# Patient Record
Sex: Male | Born: 1940 | Hispanic: No
Health system: Southern US, Community
[De-identification: ages and names within clinical notes are randomized; demographics above are authoritative.]

## PROBLEM LIST (undated history)

## (undated) DIAGNOSIS — J449 Chronic obstructive pulmonary disease, unspecified: Secondary | ICD-10-CM

## (undated) DIAGNOSIS — J15211 Pneumonia due to Methicillin susceptible Staphylococcus aureus: Secondary | ICD-10-CM

## (undated) DIAGNOSIS — M301 Polyarteritis with lung involvement [Churg-Strauss]: Secondary | ICD-10-CM

## (undated) DIAGNOSIS — D7218 Eosinophilia in diseases classified elsewhere: Secondary | ICD-10-CM

## (undated) DIAGNOSIS — J9621 Acute and chronic respiratory failure with hypoxia: Secondary | ICD-10-CM

---

## 2020-02-28 ENCOUNTER — Other Ambulatory Visit (HOSPITAL_COMMUNITY): Payer: Federal, State, Local not specified - PPO

## 2020-02-28 ENCOUNTER — Inpatient Hospital Stay
Admission: RE | Admit: 2020-02-28 | Discharge: 2020-03-17 | Disposition: A | Discharge status: Skilled Nursing Facility | Payer: Federal, State, Local not specified - PPO | Source: Other Acute Inpatient Hospital

## 2020-02-28 DIAGNOSIS — J15211 Pneumonia due to Methicillin susceptible Staphylococcus aureus: Secondary | ICD-10-CM | POA: Diagnosis present

## 2020-02-28 DIAGNOSIS — J189 Pneumonia, unspecified organism: Secondary | ICD-10-CM

## 2020-02-28 DIAGNOSIS — J398 Other specified diseases of upper respiratory tract: Secondary | ICD-10-CM

## 2020-02-28 DIAGNOSIS — J449 Chronic obstructive pulmonary disease, unspecified: Secondary | ICD-10-CM | POA: Diagnosis present

## 2020-02-28 DIAGNOSIS — M301 Polyarteritis with lung involvement [Churg-Strauss]: Secondary | ICD-10-CM | POA: Diagnosis present

## 2020-02-28 DIAGNOSIS — Z931 Gastrostomy status: Secondary | ICD-10-CM

## 2020-02-28 DIAGNOSIS — J9621 Acute and chronic respiratory failure with hypoxia: Secondary | ICD-10-CM | POA: Diagnosis present

## 2020-02-28 DIAGNOSIS — J9690 Respiratory failure, unspecified, unspecified whether with hypoxia or hypercapnia: Secondary | ICD-10-CM

## 2020-02-28 DIAGNOSIS — D7218 Eosinophilia in diseases classified elsewhere: Secondary | ICD-10-CM | POA: Diagnosis present

## 2020-02-28 HISTORY — DX: Polyarteritis with lung involvement (churg-strauss): M30.1

## 2020-02-28 HISTORY — DX: Acute and chronic respiratory failure with hypoxia: J96.21

## 2020-02-28 HISTORY — DX: Eosinophilia in diseases classified elsewhere: D72.18

## 2020-02-28 HISTORY — DX: Pneumonia due to methicillin susceptible Staphylococcus aureus: J15.211

## 2020-02-28 HISTORY — DX: Chronic obstructive pulmonary disease, unspecified: J44.9

## 2020-02-28 MED ORDER — IOHEXOL 300 MG/ML  SOLN: 50.0000 mL | Freq: Once | INTRAMUSCULAR | Status: DC | PRN

## 2020-02-29 ENCOUNTER — Other Ambulatory Visit (HOSPITAL_COMMUNITY): Payer: Federal, State, Local not specified - PPO

## 2020-02-29 DIAGNOSIS — J449 Chronic obstructive pulmonary disease, unspecified: Secondary | ICD-10-CM | POA: Diagnosis present

## 2020-02-29 DIAGNOSIS — D7218 Eosinophilia in diseases classified elsewhere: Secondary | ICD-10-CM | POA: Diagnosis present

## 2020-02-29 DIAGNOSIS — J15211 Pneumonia due to Methicillin susceptible Staphylococcus aureus: Secondary | ICD-10-CM | POA: Diagnosis present

## 2020-02-29 DIAGNOSIS — J9621 Acute and chronic respiratory failure with hypoxia: Secondary | ICD-10-CM

## 2020-02-29 DIAGNOSIS — M301 Polyarteritis with lung involvement [Churg-Strauss]: Secondary | ICD-10-CM | POA: Diagnosis not present

## 2020-02-29 LAB — COMPREHENSIVE METABOLIC PANEL
ALT: 23 U/L (ref 0–44)
AST: 19 U/L (ref 15–41)
Albumin: 2.6 g/dL — ABNORMAL LOW (ref 3.5–5.0)
Alkaline Phosphatase: 62 U/L (ref 38–126)
Anion gap: 6 (ref 5–15)
BUN: 21 mg/dL (ref 8–23)
CO2: 38 mmol/L — ABNORMAL HIGH (ref 22–32)
Calcium: 9.2 mg/dL (ref 8.9–10.3)
Chloride: 92 mmol/L — ABNORMAL LOW (ref 98–111)
Creatinine, Ser: 0.69 mg/dL (ref 0.61–1.24)
GFR calc Af Amer: >60 mL/min (ref >60)
GFR calc non Af Amer: >60 mL/min (ref >60)
Glucose, Bld: 132 mg/dL — ABNORMAL HIGH (ref 70–99)
Potassium: 4.2 mmol/L (ref 3.5–5.1)
Sodium: 136 mmol/L (ref 135–145)
Total Bilirubin: 0.5 mg/dL (ref 0.3–1.2)
Total Protein: 6.2 g/dL — ABNORMAL LOW (ref 6.5–8.1)

## 2020-02-29 LAB — CBC
HCT: 29.5 % — ABNORMAL LOW (ref 39.0–52.0)
Hemoglobin: 9.4 g/dL — ABNORMAL LOW (ref 13.0–17.0)
MCH: 30.9 pg (ref 26.0–34.0)
MCHC: 31.9 g/dL (ref 30.0–36.0)
MCV: 97 fL (ref 80.0–100.0)
Platelets: 299 10*3/uL (ref 150–400)
RBC: 3.04 MIL/uL — ABNORMAL LOW (ref 4.22–5.81)
RDW: 12.8 % (ref 11.5–15.5)
WBC: 6.5 10*3/uL (ref 4.0–10.5)
nRBC: 0 % (ref 0.0–0.2)

## 2020-02-29 LAB — PROTIME-INR
INR: 1.1 (ref 0.8–1.2)
Prothrombin Time: 13.9 seconds (ref 11.4–15.2)

## 2020-02-29 MED ORDER — IOHEXOL 350 MG/ML SOLN
80.0000 mL | Freq: Once | INTRAVENOUS | Status: AC | PRN
  Administered 2020-02-29: 80 mL via INTRAVENOUS

## 2020-02-29 NOTE — Consult Note (Signed)
Pulmonary Critical Care Medicine [facility] [place]  PULMONARY SERVICE  Date of Service: 02/29/2020  PULMONARY CRITICAL CARE CONSULT   Vincent Archer  KVQ:[id number]  DOB: 01/24/1941   DOA: 02/28/2020  Referring Physician: Carron Curie, MD  HPI: Vincent Archer is a 79 y.o. male seen for follow up of Acute on Chronic Respiratory Failure.  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  Apparently has a history of oropharyngeal dysphagia and had been initially seen by IV social worker placed in a hospital admission and then discharged to skilled nursing facility.  Patient came back to the hospital because of sepsis and shock.  The patient was treated with antibiotics had some issues with ongoing respiratory failure requiring oxygen.  Patient right now is on a face tent requiring 35% FiO2.  Chest x-ray revealed no definite acute changes however he did have some abnormalities of the diaphragms.  Therefore recommended that we get a CT scan.  Review of Systems:  ROS performed and is unremarkable other than noted above.  Past medical history: COPD Obstructive sleep apnea Chronic obstructive asthma Pulmonary embolism BPH Churg-Strauss on steroids Sinusitis  Past surgical history: Maxillary sinus resection G-tube placement  Family history: Unknown  Social history: Apparently was a resident of a nursing home  Allergies: Reviewed on the medical chart  Medications: Reviewed on Rounds  Physical Exam:  Vitals: T 96.8 pulse 91 respiratory rate 34 blood pressure 112/64 saturations 98%  Ventilator Settings off of the ventilator 35% FiO2 on face tent  . General: Comfortable at this time . Eyes: Grossly normal lids, irises & conjunctiva . ENT: grossly tongue is normal . Neck: no obvious mass . Cardiovascular: S1-S2 normal no gallop or  rub . Respiratory: No rhonchi no rales are noted . Abdomen: Soft and nontender . Skin: no rash seen on limited exam . Musculoskeletal: not rigid . Psychiatric:unable to assess . Neurologic: no seizure no involuntary movements         Labs on Admission:  Basic Metabolic Panel: Recent Labs  Lab 02/29/20 0651  NA 136  K 4.2  CL 92*  CO2 38*  GLUCOSE 132*  BUN 21  CREATININE 0.69  CALCIUM 9.2    No results for input(s): PHART, PCO2ART, PO2ART, HCO3, O2SAT in the last 168 hours.  Liver Function Tests: Recent Labs  Lab 02/29/20 0651  AST 19  ALT 23  ALKPHOS 62  BILITOT 0.5  PROT 6.2*  ALBUMIN 2.6*   No results for input(s): LIPASE, AMYLASE in the last 168 hours. No results for input(s): AMMONIA in the last 168 hours.  CBC: Recent Labs  Lab 02/29/20 0651  WBC 6.5  HGB 9.4*  HCT 29.5*  MCV 97.0  PLT 299    Cardiac Enzymes: No results for input(s): CKTOTAL, CKMB, CKMBINDEX, TROPONINI in the last 168 hours.  BNP (last 3 results) No results for input(s): BNP in the last 8760 hours.  ProBNP (last 3 results) No results for input(s): PROBNP in the last 8760 hours.   Radiological Exams on Admission: DG ABDOMEN PEG TUBE LOCATION  Result Date: 02/28/2020 CLINICAL DATA:  Peg tube placement. EXAM: ABDOMEN - 1 VIEW COMPARISON:  None. FINDINGS: Injection of contrast through the patient's PEG tube opacifies the stomach. A small amount of contrast is noted in the proximal duodenum. There is no definite pneumoperitoneum. The bowel gas pattern is nonobstructive. IMPRESSION: Injection of contrast through  the patient's PEG tube opacifies the stomach. Electronically Signed   By: Constance Holster M.D.   On: 02/28/2020 23:46   DG CHEST PORT 1 VIEW  Result Date: 02/28/2020 CLINICAL DATA:  Respiratory failure. EXAM: PORTABLE CHEST 1 VIEW COMPARISON:  None. FINDINGS: The lungs are hyperexpanded. There is some questionable thickening of the right paratracheal stripe. There is no  pneumothorax. There is some pleuroparenchymal scarring at the lung apices. There is no large focal infiltrate. No significant pleural effusion. The heart size is normal. Aortic calcifications are noted. There is no acute osseous abnormality. IMPRESSION: 1. No definite acute cardiopulmonary process. 2. COPD. 3. Suggestion of a thickening of the right paratracheal stripe. An outpatient contrast enhanced CT is recommended for further evaluation of this finding. Electronically Signed   By: Constance Holster M.D.   On: 02/28/2020 23:45    Assessment/Plan Active Problems:   Acute on chronic respiratory failure with hypoxia (HCC)   COPD, severe (HCC)   Churg-Strauss syndrome with lung involvement (HCC)   MSSA (methicillin susceptible Staphylococcus aureus) pneumonia ([place])   1. Acute on chronic respiratory failure with hypoxia patient has significant hypoxia noted follow-up chest x-ray looks relatively benign.  Because of the findings of the significant hypoxia recommended getting a CT scan of the chest to make certain there is no underlying pulmonary vascular disease. 2. Severe COPD patient should be on nebulizers as necessary we will continue to follow along. 3. Churg-Strauss syndrome again CT scan was ordered no definitive changes were noted on the chest x-ray.  Patient is on chronic steroids. 4. MSSA pneumonia treated with antibiotics we will continue with supportive care follow radiologically   I have personally seen and evaluated the patient, evaluated laboratory and imaging results, formulated the assessment and plan and placed orders. The Patient requires high complexity decision making with multiple systems involvement.  Case was discussed on Rounds with the Respiratory Therapy Director and the Respiratory staff Time Spent 41minutes  Hanifa Antonetti A Eilee Schader, MD [facility] Pulmonary Critical Care Medicine Sleep Medicine

## 2020-03-01 DIAGNOSIS — J449 Chronic obstructive pulmonary disease, unspecified: Secondary | ICD-10-CM | POA: Diagnosis not present

## 2020-03-01 DIAGNOSIS — J15211 Pneumonia due to Methicillin susceptible Staphylococcus aureus: Secondary | ICD-10-CM | POA: Diagnosis not present

## 2020-03-01 DIAGNOSIS — M301 Polyarteritis with lung involvement [Churg-Strauss]: Secondary | ICD-10-CM | POA: Diagnosis not present

## 2020-03-01 DIAGNOSIS — J9621 Acute and chronic respiratory failure with hypoxia: Secondary | ICD-10-CM | POA: Diagnosis not present

## 2020-03-01 NOTE — Progress Notes (Signed)
Pulmonary Critical Care Medicine [facility] [place]   PULMONARY CRITICAL CARE SERVICE  PROGRESS NOTE  Date of Service: 03/01/2020  Vincent Archer  WVP:[id number]  DOB: 01/24/1941   DOA: 02/28/2020  Referring Physician: Carron Curie, MD  HPI: Vincent Archer is a 79 y.o. male seen for follow up of Acute on Chronic Respiratory Failure.  Patient at this time is comfortable without distress was on 35% face tent.  CT scan was done yesterday shows areas of bronchiectasis some areas of infiltrate.  Patient has an underlying diagnosis of Churg-Strauss as already noted previously however there is some blood in tree appearance also concerning for MAI infection.  Patient has been on chronic steroids however patient is not on any PCP prophylaxis  Medications: Reviewed on Rounds  Physical Exam:  Vitals: Temperature 97.0 pulse 94 respiratory rate 30 blood pressure is 144/71 saturations 96%  Ventilator Settings off the ventilator on 35% FiO2  . General: Comfortable at this time . Eyes: Grossly normal lids, irises & conjunctiva . ENT: grossly tongue is normal . Neck: no obvious mass . Cardiovascular: S1 S2 normal no gallop . Respiratory: No rhonchi coarse breath sounds . Abdomen: soft . Skin: no rash seen on limited exam . Musculoskeletal: not rigid . Psychiatric:unable to assess . Neurologic: no seizure no involuntary movements         Lab Data:   Basic Metabolic Panel: Recent Labs  Lab 02/29/20 0651  NA 136  K 4.2  CL 92*  CO2 38*  GLUCOSE 132*  BUN 21  CREATININE 0.69  CALCIUM 9.2    ABG: No results for input(s): PHART, PCO2ART, PO2ART, HCO3, O2SAT in the last 168 hours.  Liver Function Tests: Recent Labs  Lab 02/29/20 0651  AST 19  ALT 23  ALKPHOS 62  BILITOT 0.5  PROT 6.2*  ALBUMIN 2.6*   No results for input(s): LIPASE, AMYLASE in the last 168 hours. No results for input(s): AMMONIA in the last 168 hours.  CBC: Recent Labs  Lab 02/29/20 0651  WBC 6.5   HGB 9.4*  HCT 29.5*  MCV 97.0  PLT 299    Cardiac Enzymes: No results for input(s): CKTOTAL, CKMB, CKMBINDEX, TROPONINI in the last 168 hours.  BNP (last 3 results) No results for input(s): BNP in the last 8760 hours.  ProBNP (last 3 results) No results for input(s): PROBNP in the last 8760 hours.  Radiological Exams: CT ANGIO CHEST PE W OR WO CONTRAST  Result Date: 02/29/2020 CLINICAL DATA:  Shortness of breath EXAM: CT ANGIOGRAPHY CHEST WITH CONTRAST TECHNIQUE: Multidetector CT imaging of the chest was performed using the standard protocol during bolus administration of intravenous contrast. Multiplanar CT image reconstructions and MIPs were obtained to evaluate the vascular anatomy. CONTRAST:  49mL OMNIPAQUE IOHEXOL 350 MG/ML SOLN COMPARISON:  None. FINDINGS: Cardiovascular: Contrast injection is sufficient to demonstrate satisfactory opacification of the pulmonary arteries to the segmental level. There is no pulmonary embolus. The main pulmonary artery is within normal limits for size. There are atherosclerotic changes of the thoracic aorta without evidence for aneurysm. The heart size is normal. There is a small pericardial effusion. Coronary artery calcifications are noted. Mediastinum/Nodes: --No mediastinal or hilar lymphadenopathy. --No axillary lymphadenopathy. --No supraclavicular lymphadenopathy. --Normal thyroid gland. --The esophagus is unremarkable Lungs/Pleura: Extensive bronchial wall thickening and mucus plugging at the lung bases bilaterally. There are tree in bud airspace opacities in the right upper lobe. There is bronchiectasis bilaterally, most evident in the left upper lobe. There is debris  within the right mainstem bronchus and lower trachea. There is no pneumothorax. There is no large pleural effusion. The lungs are hyperexpanded. There is atelectasis and scarring at the lung bases. Upper Abdomen: A percutaneous gastrostomy tube is partially visualized in the upper  abdomen. There is no definite acute abnormality within the upper abdomen. Musculoskeletal: There is an age-indeterminate severe compression fracture of the T6 vertebral body. There is no significant retropulsion at this level. This fractures favored to be chronic. Review of the MIP images confirms the above findings. IMPRESSION: 1. No acute pulmonary embolism. 2. Findings suggestive of infectious or reactive bronchiolitis as detailed above. 3. Hyperexpanded lungs which can be seen in patients with COPD. 4. Small pericardial effusion. 5. Age-indeterminate compression fracture of the T6 vertebral body, favored to be chronic. Correlation with physical exam is recommended. Aortic Atherosclerosis (ICD10-I70.0). Electronically Signed   By: Constance Holster M.D.   On: 02/29/2020 17:44   DG ABDOMEN PEG TUBE LOCATION  Result Date: 02/28/2020 CLINICAL DATA:  Peg tube placement. EXAM: ABDOMEN - 1 VIEW COMPARISON:  None. FINDINGS: Injection of contrast through the patient's PEG tube opacifies the stomach. A small amount of contrast is noted in the proximal duodenum. There is no definite pneumoperitoneum. The bowel gas pattern is nonobstructive. IMPRESSION: Injection of contrast through the patient's PEG tube opacifies the stomach. Electronically Signed   By: Constance Holster M.D.   On: 02/28/2020 23:46   DG CHEST PORT 1 VIEW  Result Date: 02/28/2020 CLINICAL DATA:  Respiratory failure. EXAM: PORTABLE CHEST 1 VIEW COMPARISON:  None. FINDINGS: The lungs are hyperexpanded. There is some questionable thickening of the right paratracheal stripe. There is no pneumothorax. There is some pleuroparenchymal scarring at the lung apices. There is no large focal infiltrate. No significant pleural effusion. The heart size is normal. Aortic calcifications are noted. There is no acute osseous abnormality. IMPRESSION: 1. No definite acute cardiopulmonary process. 2. COPD. 3. Suggestion of a thickening of the right paratracheal  stripe. An outpatient contrast enhanced CT is recommended for further evaluation of this finding. Electronically Signed   By: Constance Holster M.D.   On: 02/28/2020 23:45    Assessment/Plan Active Problems:   Acute on chronic respiratory failure with hypoxia (HCC)   COPD, severe (HCC)   Churg-Strauss syndrome with lung involvement (HCC)   MSSA (methicillin susceptible Staphylococcus aureus) pneumonia ([place])   1. Acute on chronic respiratory failure with hypoxia plan is to continue with full oxygen therapy supportive care.  The CT findings are of concern with traction bronchiectasis also some groundglass opacity changes were noted.  I have recommended that we get a QuantiFERON gold assay on him and also he should be on Bactrim prophylaxis. 2. Churg-Strauss syndrome patient is on chronic steroid therapy should also be on Bactrim prophylaxis which I went ahead and started him on. 3. MSSA pneumonia treated we will continue with supportive care 4. Severe COPD at baseline continue to monitor.   I have personally seen and evaluated the patient, evaluated laboratory and imaging results, formulated the assessment and plan and placed orders. The Patient requires high complexity decision making with multiple systems involvement.  Rounds were done with the Respiratory Therapy Director and Staff therapists and discussed with nursing staff also.  Allyne Gee, MD [facility] Pulmonary Critical Care Medicine Sleep Medicine

## 2020-03-02 DIAGNOSIS — J15211 Pneumonia due to Methicillin susceptible Staphylococcus aureus: Secondary | ICD-10-CM | POA: Diagnosis not present

## 2020-03-02 DIAGNOSIS — J449 Chronic obstructive pulmonary disease, unspecified: Secondary | ICD-10-CM | POA: Diagnosis not present

## 2020-03-02 DIAGNOSIS — J9621 Acute and chronic respiratory failure with hypoxia: Secondary | ICD-10-CM | POA: Diagnosis not present

## 2020-03-02 DIAGNOSIS — M301 Polyarteritis with lung involvement [Churg-Strauss]: Secondary | ICD-10-CM | POA: Diagnosis not present

## 2020-03-02 LAB — CBC
HCT: 29.1 % — ABNORMAL LOW (ref 39.0–52.0)
Hemoglobin: 9.1 g/dL — ABNORMAL LOW (ref 13.0–17.0)
MCH: 30 pg (ref 26.0–34.0)
MCHC: 31.3 g/dL (ref 30.0–36.0)
MCV: 96 fL (ref 80.0–100.0)
Platelets: 295 10*3/uL (ref 150–400)
RBC: 3.03 MIL/uL — ABNORMAL LOW (ref 4.22–5.81)
RDW: 12.7 % (ref 11.5–15.5)
WBC: 6.9 10*3/uL (ref 4.0–10.5)
nRBC: 0 % (ref 0.0–0.2)

## 2020-03-02 LAB — BASIC METABOLIC PANEL
Anion gap: 5 (ref 5–15)
BUN: 34 mg/dL — ABNORMAL HIGH (ref 8–23)
CO2: 38 mmol/L — ABNORMAL HIGH (ref 22–32)
Calcium: 9 mg/dL (ref 8.9–10.3)
Chloride: 91 mmol/L — ABNORMAL LOW (ref 98–111)
Creatinine, Ser: 0.84 mg/dL (ref 0.61–1.24)
GFR calc Af Amer: >60 mL/min (ref >60)
GFR calc non Af Amer: >60 mL/min (ref >60)
Glucose, Bld: 134 mg/dL — ABNORMAL HIGH (ref 70–99)
Potassium: 4.4 mmol/L (ref 3.5–5.1)
Sodium: 134 mmol/L — ABNORMAL LOW (ref 135–145)

## 2020-03-02 LAB — MAGNESIUM: Magnesium: 2 mg/dL (ref 1.7–2.4)

## 2020-03-02 LAB — PHOSPHORUS: Phosphorus: 3.4 mg/dL (ref 2.5–4.6)

## 2020-03-02 NOTE — Progress Notes (Signed)
Pulmonary Critical Care Medicine [facility] [place]   PULMONARY CRITICAL CARE SERVICE  PROGRESS NOTE  Date of Service: 03/02/2020  Vincent Archer  RUE:[id number]  DOB: 01/24/1941   DOA: 02/28/2020  Referring Physician: Carron Curie, MD  HPI: Vincent Archer is a 79 y.o. male seen for follow up of Acute on Chronic Respiratory Failure.  Patient currently is on 35% FiO2 by face tent no significant worsening of the oxygen has been noted.  CT scan had shown significant bronchiectasis consistent with chronic pulmonary disease there was some concern about possibility of an MAI infection with a tree-in-bud appearance  Medications: Reviewed on Rounds  Physical Exam:  Vitals: Temperature 97.0 pulse 117 respiratory rate 26 blood pressure is 151/70 saturations 97%  Ventilator Settings on 35% FiO2  . General: Comfortable at this time . Eyes: Grossly normal lids, irises & conjunctiva . ENT: grossly tongue is normal . Neck: no obvious mass . Cardiovascular: S1 S2 normal no gallop . Respiratory: Scattered rhonchi noted bilaterally . Abdomen: soft . Skin: no rash seen on limited exam . Musculoskeletal: not rigid . Psychiatric:unable to assess . Neurologic: no seizure no involuntary movements         Lab Data:   Basic Metabolic Panel: Recent Labs  Lab 02/29/20 0651 03/02/20 0417  NA 136 134*  K 4.2 4.4  CL 92* 91*  CO2 38* 38*  GLUCOSE 132* 134*  BUN 21 34*  CREATININE 0.69 0.84  CALCIUM 9.2 9.0  MG  --  2.0  PHOS  --  3.4    ABG: No results for input(s): PHART, PCO2ART, PO2ART, HCO3, O2SAT in the last 168 hours.  Liver Function Tests: Recent Labs  Lab 02/29/20 0651  AST 19  ALT 23  ALKPHOS 62  BILITOT 0.5  PROT 6.2*  ALBUMIN 2.6*   No results for input(s): LIPASE, AMYLASE in the last 168 hours. No results for input(s): AMMONIA in the last 168 hours.  CBC: Recent Labs  Lab 02/29/20 0651 03/02/20 0417  WBC 6.5 6.9  HGB 9.4* 9.1*  HCT 29.5* 29.1*  MCV 97.0  96.0  PLT 299 295    Cardiac Enzymes: No results for input(s): CKTOTAL, CKMB, CKMBINDEX, TROPONINI in the last 168 hours.  BNP (last 3 results) No results for input(s): BNP in the last 8760 hours.  ProBNP (last 3 results) No results for input(s): PROBNP in the last 8760 hours.  Radiological Exams: No results found.  Assessment/Plan Active Problems:   Acute on chronic respiratory failure with hypoxia (HCC)   COPD, severe (HCC)   Churg-Strauss syndrome with lung involvement (HCC)   MSSA (methicillin susceptible Staphylococcus aureus) pneumonia (HCC)   1. Chronic respiratory failure with hypoxia patient is maintaining oxygenation plan is to continue with current oxygen levels and monitor closely. 2. Severe COPD advanced disease we will continue with nebulizers as needed 3. Churg-Strauss syndrome advanced disease again with bronchiectatic changes and some groundglass opacity changes noted still being present patient is on chronic steroids.  Patient was started on Bactrim prophylaxis also awaiting results of the QuantiFERON gold 4. MSSA pneumonia treated we will continue with supportive care   I have personally seen and evaluated the patient, evaluated laboratory and imaging results, formulated the assessment and plan and placed orders. The Patient requires high complexity decision making with multiple systems involvement.  Rounds were done with the Respiratory Therapy Director and Staff therapists and discussed with nursing staff also.  Yevonne Pax, MD [facility] Pulmonary Critical Care Medicine Sleep  Medicine

## 2020-03-03 DIAGNOSIS — J15211 Pneumonia due to Methicillin susceptible Staphylococcus aureus: Secondary | ICD-10-CM | POA: Diagnosis not present

## 2020-03-03 DIAGNOSIS — M301 Polyarteritis with lung involvement [Churg-Strauss]: Secondary | ICD-10-CM | POA: Diagnosis not present

## 2020-03-03 DIAGNOSIS — J449 Chronic obstructive pulmonary disease, unspecified: Secondary | ICD-10-CM | POA: Diagnosis not present

## 2020-03-03 DIAGNOSIS — J9621 Acute and chronic respiratory failure with hypoxia: Secondary | ICD-10-CM | POA: Diagnosis not present

## 2020-03-03 NOTE — Progress Notes (Signed)
Pulmonary Critical Care Medicine [facility] [place]   PULMONARY CRITICAL CARE SERVICE  PROGRESS NOTE  Date of Service: 03/03/2020  Shaarav Ripple  MVH:[id number]  DOB: 01/24/1941   DOA: 02/28/2020  Referring Physician: Carron Curie, MD  HPI: Dimitrious Micciche is a 79 y.o. male seen for follow up of Acute on Chronic Respiratory Failure.  Patient at this time is on 35% FiO2 and using the face tent  Medications: Reviewed on Rounds  Physical Exam:  Vitals: Temperature is 97.6 pulse 82 respiratory 26 blood pressure is 116/62 saturations 92%  Ventilator Settings off the ventilator right now on FaceTime  . General: Comfortable at this time . Eyes: Grossly normal lids, irises & conjunctiva . ENT: grossly tongue is normal . Neck: no obvious mass . Cardiovascular: S1 S2 normal no gallop . Respiratory: No rhonchi no rales are noted at this time . Abdomen: soft . Skin: no rash seen on limited exam . Musculoskeletal: not rigid . Psychiatric:unable to assess . Neurologic: no seizure no involuntary movements         Lab Data:   Basic Metabolic Panel: Recent Labs  Lab 02/29/20 0651 03/02/20 0417  NA 136 134*  K 4.2 4.4  CL 92* 91*  CO2 38* 38*  GLUCOSE 132* 134*  BUN 21 34*  CREATININE 0.69 0.84  CALCIUM 9.2 9.0  MG  --  2.0  PHOS  --  3.4    ABG: No results for input(s): PHART, PCO2ART, PO2ART, HCO3, O2SAT in the last 168 hours.  Liver Function Tests: Recent Labs  Lab 02/29/20 0651  AST 19  ALT 23  ALKPHOS 62  BILITOT 0.5  PROT 6.2*  ALBUMIN 2.6*   No results for input(s): LIPASE, AMYLASE in the last 168 hours. No results for input(s): AMMONIA in the last 168 hours.  CBC: Recent Labs  Lab 02/29/20 0651 03/02/20 0417  WBC 6.5 6.9  HGB 9.4* 9.1*  HCT 29.5* 29.1*  MCV 97.0 96.0  PLT 299 295    Cardiac Enzymes: No results for input(s): CKTOTAL, CKMB, CKMBINDEX, TROPONINI in the last 168 hours.  BNP (last 3 results) No results for input(s): BNP in the  last 8760 hours.  ProBNP (last 3 results) No results for input(s): PROBNP in the last 8760 hours.  Radiological Exams: No results found.  Assessment/Plan Active Problems:   Acute on chronic respiratory failure with hypoxia (HCC)   COPD, severe (HCC)   Churg-Strauss syndrome with lung involvement (HCC)   MSSA (methicillin susceptible Staphylococcus aureus) pneumonia (HCC)   1. Acute on chronic respiratory failure hypoxia plan is to continue with FaceTime titrate oxygen we did try the patient on nasal cannula however patient did not tolerated 2. Severe COPD at baseline we will continue with supportive care 3. Churg-Strauss syndrome on chronic steroids and is now on Bactrim prophylaxis 4. MSSA pneumonia treated continue with supportive care   I have personally seen and evaluated the patient, evaluated laboratory and imaging results, formulated the assessment and plan and placed orders. The Patient requires high complexity decision making with multiple systems involvement.  Rounds were done with the Respiratory Therapy Director and Staff therapists and discussed with nursing staff also.  Yevonne Pax, MD [facility] Pulmonary Critical Care Medicine Sleep Medicine

## 2020-03-04 DIAGNOSIS — J15211 Pneumonia due to Methicillin susceptible Staphylococcus aureus: Secondary | ICD-10-CM | POA: Diagnosis not present

## 2020-03-04 DIAGNOSIS — M301 Polyarteritis with lung involvement [Churg-Strauss]: Secondary | ICD-10-CM | POA: Diagnosis not present

## 2020-03-04 DIAGNOSIS — J9621 Acute and chronic respiratory failure with hypoxia: Secondary | ICD-10-CM | POA: Diagnosis not present

## 2020-03-04 DIAGNOSIS — J449 Chronic obstructive pulmonary disease, unspecified: Secondary | ICD-10-CM | POA: Diagnosis not present

## 2020-03-04 LAB — RENAL FUNCTION PANEL
Albumin: 2.8 g/dL — ABNORMAL LOW (ref 3.5–5.0)
Anion gap: 8 (ref 5–15)
BUN: 41 mg/dL — ABNORMAL HIGH (ref 8–23)
CO2: 40 mmol/L — ABNORMAL HIGH (ref 22–32)
Calcium: 9.5 mg/dL (ref 8.9–10.3)
Chloride: 91 mmol/L — ABNORMAL LOW (ref 98–111)
Creatinine, Ser: 0.91 mg/dL (ref 0.61–1.24)
GFR calc Af Amer: >60 mL/min (ref >60)
GFR calc non Af Amer: >60 mL/min (ref >60)
Glucose, Bld: 118 mg/dL — ABNORMAL HIGH (ref 70–99)
Phosphorus: 4.8 mg/dL — ABNORMAL HIGH (ref 2.5–4.6)
Potassium: 4.6 mmol/L (ref 3.5–5.1)
Sodium: 139 mmol/L (ref 135–145)

## 2020-03-04 LAB — CBC
HCT: 31 % — ABNORMAL LOW (ref 39.0–52.0)
Hemoglobin: 9.7 g/dL — ABNORMAL LOW (ref 13.0–17.0)
MCH: 30.8 pg (ref 26.0–34.0)
MCHC: 31.3 g/dL (ref 30.0–36.0)
MCV: 98.4 fL (ref 80.0–100.0)
Platelets: 353 10*3/uL (ref 150–400)
RBC: 3.15 MIL/uL — ABNORMAL LOW (ref 4.22–5.81)
RDW: 13.1 % (ref 11.5–15.5)
WBC: 7.7 10*3/uL (ref 4.0–10.5)
nRBC: 0 % (ref 0.0–0.2)

## 2020-03-04 LAB — MAGNESIUM: Magnesium: 2.2 mg/dL (ref 1.7–2.4)

## 2020-03-04 NOTE — Progress Notes (Signed)
Pulmonary Critical Care Medicine [facility] [place]   PULMONARY CRITICAL CARE SERVICE  PROGRESS NOTE  Date of Service: 03/04/2020  Vincent Archer  WYO:[id number]  DOB: 01/24/1941   DOA: 02/28/2020  Referring Physician: Carron Curie, MD  HPI: Vincent Archer is a 79 y.o. male seen for follow up of Acute on Chronic Respiratory Failure.  Patient currently is on facemask is now requiring 60% FiO2.  Secretions are minimal  Medications: Reviewed on Rounds  Physical Exam:  Vitals: Temperature is 96.9 pulse 83 respiratory 25 blood pressure is 107/60 saturations 97%  Ventilator Settings off the ventilator on facemask  . General: Comfortable at this time . Eyes: Grossly normal lids, irises & conjunctiva . ENT: grossly tongue is normal . Neck: no obvious mass . Cardiovascular: S1 S2 normal no gallop . Respiratory: No rhonchi no rales are noted at this time . Abdomen: soft . Skin: no rash seen on limited exam . Musculoskeletal: not rigid . Psychiatric:unable to assess . Neurologic: no seizure no involuntary movements         Lab Data:   Basic Metabolic Panel: Recent Labs  Lab 02/29/20 0651 03/02/20 0417 03/04/20 0631  NA 136 134* 139  K 4.2 4.4 4.6  CL 92* 91* 91*  CO2 38* 38* 40*  GLUCOSE 132* 134* 118*  BUN 21 34* 41*  CREATININE 0.69 0.84 0.91  CALCIUM 9.2 9.0 9.5  MG  --  2.0 2.2  PHOS  --  3.4 4.8*    ABG: No results for input(s): PHART, PCO2ART, PO2ART, HCO3, O2SAT in the last 168 hours.  Liver Function Tests: Recent Labs  Lab 02/29/20 0651 03/04/20 0631  AST 19  --   ALT 23  --   ALKPHOS 62  --   BILITOT 0.5  --   PROT 6.2*  --   ALBUMIN 2.6* 2.8*   No results for input(s): LIPASE, AMYLASE in the last 168 hours. No results for input(s): AMMONIA in the last 168 hours.  CBC: Recent Labs  Lab 02/29/20 0651 03/02/20 0417 03/04/20 0631  WBC 6.5 6.9 7.7  HGB 9.4* 9.1* 9.7*  HCT 29.5* 29.1* 31.0*  MCV 97.0 96.0 98.4  PLT 299 295 353     Cardiac Enzymes: No results for input(s): CKTOTAL, CKMB, CKMBINDEX, TROPONINI in the last 168 hours.  BNP (last 3 results) No results for input(s): BNP in the last 8760 hours.  ProBNP (last 3 results) No results for input(s): PROBNP in the last 8760 hours.  Radiological Exams: No results found.  Assessment/Plan Active Problems:   Acute on chronic respiratory failure with hypoxia (HCC)   COPD, severe (HCC)   Churg-Strauss syndrome with lung involvement (HCC)   MSSA (methicillin susceptible Staphylococcus aureus) pneumonia (HCC)   1. Acute on chronic respiratory failure hypoxia we will continue with 60% FiO2 titrate as tolerated 2. Severe COPD at baseline we will continue with present management 3. Churg-Strauss syndrome on chronic steroid therapy will continue 4. MSSA pneumonia treated we will continue to follow   I have personally seen and evaluated the patient, evaluated laboratory and imaging results, formulated the assessment and plan and placed orders. The Patient requires high complexity decision making with multiple systems involvement.  Rounds were done with the Respiratory Therapy Director and Staff therapists and discussed with nursing staff also.  Yevonne Pax, MD [facility] Pulmonary Critical Care Medicine Sleep Medicine

## 2020-03-05 DIAGNOSIS — J15211 Pneumonia due to Methicillin susceptible Staphylococcus aureus: Secondary | ICD-10-CM | POA: Diagnosis not present

## 2020-03-05 DIAGNOSIS — J9621 Acute and chronic respiratory failure with hypoxia: Secondary | ICD-10-CM | POA: Diagnosis not present

## 2020-03-05 DIAGNOSIS — M301 Polyarteritis with lung involvement [Churg-Strauss]: Secondary | ICD-10-CM | POA: Diagnosis not present

## 2020-03-05 DIAGNOSIS — J449 Chronic obstructive pulmonary disease, unspecified: Secondary | ICD-10-CM | POA: Diagnosis not present

## 2020-03-05 NOTE — Progress Notes (Signed)
Pulmonary Critical Care Medicine [facility] [place]   PULMONARY CRITICAL CARE SERVICE  PROGRESS NOTE  Date of Service: 03/05/2020  Lark Runk  HBZ:[id number]  DOB: 01/24/1941   DOA: 02/28/2020  Referring Physician: Carron Curie, MD  HPI: Armon Orvis is a 79 y.o. male seen for follow up of Acute on Chronic Respiratory Failure.  Patient currently is on 40% facemask saturations were 99%  Medications: Reviewed on Rounds  Physical Exam:  Vitals: Temperature is 96.9 pulse 89 respiratory rate 30 blood pressure is 108/62 saturations 99%  Ventilator Settings off the ventilator on 40% facemask  . General: Comfortable at this time . Eyes: Grossly normal lids, irises & conjunctiva . ENT: grossly tongue is normal . Neck: no obvious mass . Cardiovascular: S1 S2 normal no gallop . Respiratory: No rhonchi no rales are noted at this time . Abdomen: soft . Skin: no rash seen on limited exam . Musculoskeletal: not rigid . Psychiatric:unable to assess . Neurologic: no seizure no involuntary movements         Lab Data:   Basic Metabolic Panel: Recent Labs  Lab 02/29/20 0651 03/02/20 0417 03/04/20 0631  NA 136 134* 139  K 4.2 4.4 4.6  CL 92* 91* 91*  CO2 38* 38* 40*  GLUCOSE 132* 134* 118*  BUN 21 34* 41*  CREATININE 0.69 0.84 0.91  CALCIUM 9.2 9.0 9.5  MG  --  2.0 2.2  PHOS  --  3.4 4.8*    ABG: No results for input(s): PHART, PCO2ART, PO2ART, HCO3, O2SAT in the last 168 hours.  Liver Function Tests: Recent Labs  Lab 02/29/20 0651 03/04/20 0631  AST 19  --   ALT 23  --   ALKPHOS 62  --   BILITOT 0.5  --   PROT 6.2*  --   ALBUMIN 2.6* 2.8*   No results for input(s): LIPASE, AMYLASE in the last 168 hours. No results for input(s): AMMONIA in the last 168 hours.  CBC: Recent Labs  Lab 02/29/20 0651 03/02/20 0417 03/04/20 0631  WBC 6.5 6.9 7.7  HGB 9.4* 9.1* 9.7*  HCT 29.5* 29.1* 31.0*  MCV 97.0 96.0 98.4  PLT 299 295 353    Cardiac Enzymes: No  results for input(s): CKTOTAL, CKMB, CKMBINDEX, TROPONINI in the last 168 hours.  BNP (last 3 results) No results for input(s): BNP in the last 8760 hours.  ProBNP (last 3 results) No results for input(s): PROBNP in the last 8760 hours.  Radiological Exams: No results found.  Assessment/Plan Active Problems:   Acute on chronic respiratory failure with hypoxia (HCC)   COPD, severe (HCC)   Churg-Strauss syndrome with lung involvement (HCC)   MSSA (methicillin susceptible Staphylococcus aureus) pneumonia (HCC)   1. Acute on chronic respiratory failure hypoxia plan is to continue with the weaning of FiO2 down if possible 2. Severe COPD nebulizers as necessary 3. Churg-Strauss syndrome continue with current management supportive care 4. MSSA pneumonia treated we will continue to follow   I have personally seen and evaluated the patient, evaluated laboratory and imaging results, formulated the assessment and plan and placed orders. The Patient requires high complexity decision making with multiple systems involvement.  Rounds were done with the Respiratory Therapy Director and Staff therapists and discussed with nursing staff also.  Yevonne Pax, MD [facility] Pulmonary Critical Care Medicine Sleep Medicine

## 2020-03-06 DIAGNOSIS — M301 Polyarteritis with lung involvement [Churg-Strauss]: Secondary | ICD-10-CM | POA: Diagnosis not present

## 2020-03-06 DIAGNOSIS — J15211 Pneumonia due to Methicillin susceptible Staphylococcus aureus: Secondary | ICD-10-CM | POA: Diagnosis not present

## 2020-03-06 DIAGNOSIS — J9621 Acute and chronic respiratory failure with hypoxia: Secondary | ICD-10-CM | POA: Diagnosis not present

## 2020-03-06 DIAGNOSIS — J449 Chronic obstructive pulmonary disease, unspecified: Secondary | ICD-10-CM | POA: Diagnosis not present

## 2020-03-06 NOTE — Progress Notes (Signed)
Pulmonary Critical Care Medicine [facility] [place]   PULMONARY CRITICAL CARE SERVICE  PROGRESS NOTE  Date of Service: 03/06/2020  Vincent Archer  [id number]:[id number]  DOB: 01/24/1941   DOA: 02/28/2020  Referring Physician: Carron Curie, MD  HPI: Vincent Archer is a 79 y.o. male seen for follow up of Acute on Chronic Respiratory Failure.  This morning patient is comfortable on 28% FiO2 via facemask  Medications: Reviewed on Rounds  Physical Exam:  Vitals: Temperature is 98.0 pulse 109 respiratory rate 27 blood pressure is 130/70 saturations 96%  Ventilator Settings on facemask FiO2 28%  . General: Comfortable at this time . Eyes: Grossly normal lids, irises & conjunctiva . ENT: grossly tongue is normal . Neck: no obvious mass . Cardiovascular: S1 S2 normal no gallop . Respiratory: No rhonchi no rales are noted . Abdomen: soft . Skin: no rash seen on limited exam . Musculoskeletal: not rigid . Psychiatric:unable to assess . Neurologic: no seizure no involuntary movements         Lab Data:   Basic Metabolic Panel: Recent Labs  Lab 02/29/20 0651 03/02/20 0417 03/04/20 0631  NA 136 134* 139  K 4.2 4.4 4.6  CL 92* 91* 91*  CO2 38* 38* 40*  GLUCOSE 132* 134* 118*  BUN 21 34* 41*  CREATININE 0.69 0.84 0.91  CALCIUM 9.2 9.0 9.5  MG  --  2.0 2.2  PHOS  --  3.4 4.8*    ABG: No results for input(s): PHART, PCO2ART, PO2ART, HCO3, O2SAT in the last 168 hours.  Liver Function Tests: Recent Labs  Lab 02/29/20 0651 03/04/20 0631  AST 19  --   ALT 23  --   ALKPHOS 62  --   BILITOT 0.5  --   PROT 6.2*  --   ALBUMIN 2.6* 2.8*   No results for input(s): LIPASE, AMYLASE in the last 168 hours. No results for input(s): AMMONIA in the last 168 hours.  CBC: Recent Labs  Lab 02/29/20 0651 03/02/20 0417 03/04/20 0631  WBC 6.5 6.9 7.7  HGB 9.4* 9.1* 9.7*  HCT 29.5* 29.1* 31.0*  MCV 97.0 96.0 98.4  PLT 299 295 353    Cardiac Enzymes: No results for input(s):  CKTOTAL, CKMB, CKMBINDEX, TROPONINI in the last 168 hours.  BNP (last 3 results) No results for input(s): BNP in the last 8760 hours.  ProBNP (last 3 results) No results for input(s): PROBNP in the last 8760 hours.  Radiological Exams: No results found.  Assessment/Plan Active Problems:   Acute on chronic respiratory failure with hypoxia (HCC)   COPD, severe (HCC)   Churg-Strauss syndrome with lung involvement (HCC)   MSSA (methicillin susceptible Staphylococcus aureus) pneumonia (HCC)   1. Acute on chronic respiratory failure with hypoxia we will continue with the facemask 28% FiO2 titrate as tolerated hopefully be able to get onto nasal cannula. 2. Severe COPD at baseline continue with nebulizers as deemed necessary 3. Churg-Strauss chronic steroid therapy will continue with supportive care 4. MSSA pneumonia treated we will continue to monitor.   I have personally seen and evaluated the patient, evaluated laboratory and imaging results, formulated the assessment and plan and placed orders. The Patient requires high complexity decision making with multiple systems involvement.  Rounds were done with the Respiratory Therapy Director and Staff therapists and discussed with nursing staff also.  Yevonne Pax, MD [facility] Pulmonary Critical Care Medicine Sleep Medicine

## 2020-03-07 ENCOUNTER — Other Ambulatory Visit (HOSPITAL_COMMUNITY): Payer: Federal, State, Local not specified - PPO

## 2020-03-07 DIAGNOSIS — M301 Polyarteritis with lung involvement [Churg-Strauss]: Secondary | ICD-10-CM | POA: Diagnosis not present

## 2020-03-07 DIAGNOSIS — J15211 Pneumonia due to Methicillin susceptible Staphylococcus aureus: Secondary | ICD-10-CM | POA: Diagnosis not present

## 2020-03-07 DIAGNOSIS — J9621 Acute and chronic respiratory failure with hypoxia: Secondary | ICD-10-CM | POA: Diagnosis not present

## 2020-03-07 DIAGNOSIS — J449 Chronic obstructive pulmonary disease, unspecified: Secondary | ICD-10-CM | POA: Diagnosis not present

## 2020-03-07 LAB — CBC
HCT: 33 % — ABNORMAL LOW (ref 39.0–52.0)
Hemoglobin: 10 g/dL — ABNORMAL LOW (ref 13.0–17.0)
MCH: 30.7 pg (ref 26.0–34.0)
MCHC: 30.3 g/dL (ref 30.0–36.0)
MCV: 101.2 fL — ABNORMAL HIGH (ref 80.0–100.0)
Platelets: 352 10*3/uL (ref 150–400)
RBC: 3.26 MIL/uL — ABNORMAL LOW (ref 4.22–5.81)
RDW: 13.2 % (ref 11.5–15.5)
WBC: 9 10*3/uL (ref 4.0–10.5)
nRBC: 0 % (ref 0.0–0.2)

## 2020-03-07 LAB — BASIC METABOLIC PANEL
Anion gap: 9 (ref 5–15)
BUN: 51 mg/dL — ABNORMAL HIGH (ref 8–23)
CO2: 41 mmol/L — ABNORMAL HIGH (ref 22–32)
Calcium: 9.4 mg/dL (ref 8.9–10.3)
Chloride: 96 mmol/L — ABNORMAL LOW (ref 98–111)
Creatinine, Ser: 0.79 mg/dL (ref 0.61–1.24)
GFR calc Af Amer: >60 mL/min (ref >60)
GFR calc non Af Amer: >60 mL/min (ref >60)
Glucose, Bld: 125 mg/dL — ABNORMAL HIGH (ref 70–99)
Potassium: 4.4 mmol/L (ref 3.5–5.1)
Sodium: 146 mmol/L — ABNORMAL HIGH (ref 135–145)

## 2020-03-07 LAB — MAGNESIUM: Magnesium: 2.1 mg/dL (ref 1.7–2.4)

## 2020-03-07 LAB — PHOSPHORUS: Phosphorus: 3.4 mg/dL (ref 2.5–4.6)

## 2020-03-07 NOTE — Progress Notes (Signed)
Pulmonary Critical Care Medicine [facility] [place]   PULMONARY CRITICAL CARE SERVICE  PROGRESS NOTE  Date of Service: 03/07/2020  Vincent Archer  QMG:[id number]  DOB: 01/24/1941   DOA: 02/28/2020  Referring Physician: Carron Curie, MD  HPI: Vincent Archer is a 79 y.o. male seen for follow up of Acute on Chronic Respiratory Failure.  Patient is currently on 28% FiO2 face tent  Medications: Reviewed on Rounds  Physical Exam:  Vitals: Temperature is 96.6 pulse 110 respiratory 26 blood pressure is 111/77 saturations 96%  Ventilator Settings on face tent FiO2 28%  . General: Comfortable at this time . Eyes: Grossly normal lids, irises & conjunctiva . ENT: grossly tongue is normal . Neck: no obvious mass . Cardiovascular: S1 S2 normal no gallop . Respiratory: No rhonchi no rales are noted at this time . Abdomen: soft . Skin: no rash seen on limited exam . Musculoskeletal: not rigid . Psychiatric:unable to assess . Neurologic: no seizure no involuntary movements         Lab Data:   Basic Metabolic Panel: Recent Labs  Lab 03/02/20 0417 03/04/20 0631 03/07/20 0558  NA 134* 139 146*  K 4.4 4.6 4.4  CL 91* 91* 96*  CO2 38* 40* 41*  GLUCOSE 134* 118* 125*  BUN 34* 41* 51*  CREATININE 0.84 0.91 0.79  CALCIUM 9.0 9.5 9.4  MG 2.0 2.2 2.1  PHOS 3.4 4.8* 3.4    ABG: No results for input(s): PHART, PCO2ART, PO2ART, HCO3, O2SAT in the last 168 hours.  Liver Function Tests: Recent Labs  Lab 03/04/20 0631  ALBUMIN 2.8*   No results for input(s): LIPASE, AMYLASE in the last 168 hours. No results for input(s): AMMONIA in the last 168 hours.  CBC: Recent Labs  Lab 03/02/20 0417 03/04/20 0631 03/07/20 0558  WBC 6.9 7.7 9.0  HGB 9.1* 9.7* 10.0*  HCT 29.1* 31.0* 33.0*  MCV 96.0 98.4 101.2*  PLT 295 353 352    Cardiac Enzymes: No results for input(s): CKTOTAL, CKMB, CKMBINDEX, TROPONINI in the last 168 hours.  BNP (last 3 results) No results for input(s):  BNP in the last 8760 hours.  ProBNP (last 3 results) No results for input(s): PROBNP in the last 8760 hours.  Radiological Exams: DG Chest Port 1 View  Result Date: 03/07/2020 CLINICAL DATA:  Pneumonia EXAM: PORTABLE CHEST 1 VIEW COMPARISON:  02/29/2020 FINDINGS: Normal heart size and mediastinal contours. Hyperinflation with diaphragm flattening. Chronic airway thickening best seen by recent chest CT. No acute infiltrate or edema. No effusion or pneumothorax. No acute osseous findings. IMPRESSION: COPD without acute superimposed finding. Electronically Signed   By: Marnee Spring M.D.   On: 03/07/2020 05:41    Assessment/Plan Active Problems:   Acute on chronic respiratory failure with hypoxia (HCC)   COPD, severe (HCC)   Churg-Strauss syndrome with lung involvement (HCC)   MSSA (methicillin susceptible Staphylococcus aureus) pneumonia (HCC)   1. Acute on chronic respiratory failure with hypoxia we will continue with the oxygen therapy as tolerated once again of asked respiratory therapy to assess for the possibility of doing nasal cannula 2. Severe COPD at baseline we will continue present management 3. Churg-Strauss supportive care 4. MSSA pneumonia treated chest x-ray looks good   I have personally seen and evaluated the patient, evaluated laboratory and imaging results, formulated the assessment and plan and placed orders. The Patient requires high complexity decision making with multiple systems involvement.  Rounds were done with the Respiratory Therapy Director and Staff  therapists and discussed with nursing staff also.  Allyne Gee, MD [facility] Pulmonary Critical Care Medicine Sleep Medicine

## 2020-03-08 DIAGNOSIS — J449 Chronic obstructive pulmonary disease, unspecified: Secondary | ICD-10-CM | POA: Diagnosis not present

## 2020-03-08 DIAGNOSIS — M301 Polyarteritis with lung involvement [Churg-Strauss]: Secondary | ICD-10-CM | POA: Diagnosis not present

## 2020-03-08 DIAGNOSIS — J15211 Pneumonia due to Methicillin susceptible Staphylococcus aureus: Secondary | ICD-10-CM | POA: Diagnosis not present

## 2020-03-08 DIAGNOSIS — J9621 Acute and chronic respiratory failure with hypoxia: Secondary | ICD-10-CM | POA: Diagnosis not present

## 2020-03-08 LAB — BASIC METABOLIC PANEL
Anion gap: 7 (ref 5–15)
BUN: 45 mg/dL — ABNORMAL HIGH (ref 8–23)
CO2: 42 mmol/L — ABNORMAL HIGH (ref 22–32)
Calcium: 9.3 mg/dL (ref 8.9–10.3)
Chloride: 93 mmol/L — ABNORMAL LOW (ref 98–111)
Creatinine, Ser: 0.81 mg/dL (ref 0.61–1.24)
GFR calc Af Amer: >60 mL/min (ref >60)
GFR calc non Af Amer: >60 mL/min (ref >60)
Glucose, Bld: 108 mg/dL — ABNORMAL HIGH (ref 70–99)
Potassium: 4.6 mmol/L (ref 3.5–5.1)
Sodium: 142 mmol/L (ref 135–145)

## 2020-03-08 NOTE — Progress Notes (Signed)
Pulmonary Critical Care Medicine [place]   PULMONARY CRITICAL CARE SERVICE  PROGRESS NOTE  Date of Service: 03/08/2020  Vincent Archer  ELF:[id number]  DOB: 01/24/1941   DOA: 02/28/2020  Referring Physician: Merton Border, MD  HPI: Vincent Archer is a 79 y.o. male seen for follow up of Acute on Chronic Respiratory Failure.  Patient at this time is on the facemask on 35% FiO2.  Respiratory therapy noted that the patient wants to be transferred to the [facility] family is going to be contacted and the primary care team is going to be working on that  Medications: Reviewed on Rounds  Physical Exam:  Vitals: Temperature 97.2 pulse 76 respiratory rate 16 blood pressure is 107/51 saturations 99%  Ventilator Settings on facemask FiO2 35%  . General: Comfortable at this time . Eyes: Grossly normal lids, irises & conjunctiva . ENT: grossly tongue is normal . Neck: no obvious mass . Cardiovascular: S1 S2 normal no gallop . Respiratory: No rhonchi no rales are noted at this time . Abdomen: soft . Skin: no rash seen on limited exam . Musculoskeletal: not rigid . Psychiatric:unable to assess . Neurologic: no seizure no involuntary movements         Lab Data:   Basic Metabolic Panel: Recent Labs  Lab 03/02/20 0417 03/04/20 0631 03/07/20 0558 03/08/20 0458  NA 134* 139 146* 142  K 4.4 4.6 4.4 4.6  CL 91* 91* 96* 93*  CO2 38* 40* 41* 42*  GLUCOSE 134* 118* 125* 108*  BUN 34* 41* 51* 45*  CREATININE 0.84 0.91 0.79 0.81  CALCIUM 9.0 9.5 9.4 9.3  MG 2.0 2.2 2.1  --   PHOS 3.4 4.8* 3.4  --     ABG: No results for input(s): PHART, PCO2ART, PO2ART, HCO3, O2SAT in the last 168 hours.  Liver Function Tests: Recent Labs  Lab 03/04/20 0631  ALBUMIN 2.8*   No results for input(s): LIPASE, AMYLASE in the last 168 hours. No results for input(s): AMMONIA in the last 168 hours.  CBC: Recent Labs  Lab 03/02/20 0417 03/04/20 0631 03/07/20 0558  WBC 6.9 7.7  9.0  HGB 9.1* 9.7* 10.0*  HCT 29.1* 31.0* 33.0*  MCV 96.0 98.4 101.2*  PLT 295 353 352    Cardiac Enzymes: No results for input(s): CKTOTAL, CKMB, CKMBINDEX, TROPONINI in the last 168 hours.  BNP (last 3 results) No results for input(s): BNP in the last 8760 hours.  ProBNP (last 3 results) No results for input(s): PROBNP in the last 8760 hours.  Radiological Exams: DG Chest Port 1 View  Result Date: 03/07/2020 CLINICAL DATA:  Pneumonia EXAM: PORTABLE CHEST 1 VIEW COMPARISON:  02/29/2020 FINDINGS: Normal heart size and mediastinal contours. Hyperinflation with diaphragm flattening. Chronic airway thickening best seen by recent chest CT. No acute infiltrate or edema. No effusion or pneumothorax. No acute osseous findings. IMPRESSION: COPD without acute superimposed finding. Electronically Signed   By: Monte Fantasia M.D.   On: 03/07/2020 05:41    Assessment/Plan Active Problems:   Acute on chronic respiratory failure with hypoxia (HCC)   COPD, severe (HCC)   Churg-Strauss syndrome with lung involvement (HCC)   MSSA (methicillin susceptible Staphylococcus aureus) pneumonia ([place])   1. Acute on chronic respiratory failure with hypoxia patient is on 35% FiO2 chest x-ray showed no acute findings has chronic obstructive pulmonary disease 2. Severe COPD nebulizers as needed prognosis quite guarded 3. Churg-Strauss at baseline we will continue with supportive care and prophylactic antibiotics 4.  MSSA pneumonia treated we will continue with supportive care has been treated with antibiotics   I have personally seen and evaluated the patient, evaluated laboratory and imaging results, formulated the assessment and plan and placed orders. The Patient requires high complexity decision making with multiple systems involvement.  Rounds were done with the Respiratory Therapy Director and Staff therapists and discussed with nursing staff also.  Yevonne Pax, MD [facility] Pulmonary Critical Care  Medicine Sleep Medicine

## 2020-03-09 DIAGNOSIS — J9621 Acute and chronic respiratory failure with hypoxia: Secondary | ICD-10-CM | POA: Diagnosis not present

## 2020-03-09 DIAGNOSIS — J449 Chronic obstructive pulmonary disease, unspecified: Secondary | ICD-10-CM | POA: Diagnosis not present

## 2020-03-09 DIAGNOSIS — J15211 Pneumonia due to Methicillin susceptible Staphylococcus aureus: Secondary | ICD-10-CM | POA: Diagnosis not present

## 2020-03-09 DIAGNOSIS — M301 Polyarteritis with lung involvement [Churg-Strauss]: Secondary | ICD-10-CM | POA: Diagnosis not present

## 2020-03-09 LAB — QUANTIFERON-TB GOLD PLUS (RQFGPL)
QuantiFERON Mitogen Value: 0.77 IU/mL
QuantiFERON Nil Value: 0.2 IU/mL
QuantiFERON TB1 Ag Value: 0.14 IU/mL
QuantiFERON TB2 Ag Value: 0.13 IU/mL

## 2020-03-09 LAB — QUANTIFERON-TB GOLD PLUS: QuantiFERON-TB Gold Plus: NEGATIVE

## 2020-03-09 NOTE — Progress Notes (Addendum)
Pulmonary Critical Care Medicine [facility] [place]   PULMONARY CRITICAL CARE SERVICE  PROGRESS NOTE  Date of Service: 03/09/2020  Vincent Archer  [id number]:[id number]  DOB: 1941/02/23   DOA: 02/28/2020  Referring Physician: Carron Curie, MD  HPI: Vincent Archer is a 79 y.o. male seen for follow up of Acute on Chronic Respiratory Failure.  Patient currently using 35% oxygen on aerosol mask.  Medications: Reviewed on Rounds  Physical Exam:  Vitals: Pulse 91 respirations 30 BP 128/69 O2 sat 92% temp 97.0  Ventilator Settings 35%   . General: Comfortable at this time . Eyes: Grossly normal lids, irises & conjunctiva . ENT: grossly tongue is normal . Neck: no obvious mass . Cardiovascular: S1 S2 normal no gallop . Respiratory: No rales or rhonchi noted . Abdomen: soft . Skin: no rash seen on limited exam . Musculoskeletal: not rigid . Psychiatric:unable to assess . Neurologic: no seizure no involuntary movements         Lab Data:   Basic Metabolic Panel: Recent Labs  Lab 03/04/20 0631 03/07/20 0558 03/08/20 0458  NA 139 146* 142  K 4.6 4.4 4.6  CL 91* 96* 93*  CO2 40* 41* 42*  GLUCOSE 118* 125* 108*  BUN 41* 51* 45*  CREATININE 0.91 0.79 0.81  CALCIUM 9.5 9.4 9.3  MG 2.2 2.1  --   PHOS 4.8* 3.4  --     ABG: No results for input(s): PHART, PCO2ART, PO2ART, HCO3, O2SAT in the last 168 hours.  Liver Function Tests: Recent Labs  Lab 03/04/20 0631  ALBUMIN 2.8*   No results for input(s): LIPASE, AMYLASE in the last 168 hours. No results for input(s): AMMONIA in the last 168 hours.  CBC: Recent Labs  Lab 03/04/20 0631 03/07/20 0558  WBC 7.7 9.0  HGB 9.7* 10.0*  HCT 31.0* 33.0*  MCV 98.4 101.2*  PLT 353 352    Cardiac Enzymes: No results for input(s): CKTOTAL, CKMB, CKMBINDEX, TROPONINI in the last 168 hours.  BNP (last 3 results) No results for input(s): BNP in the last 8760 hours.  ProBNP (last 3 results) No results for input(s): PROBNP in the  last 8760 hours.  Radiological Exams: No results found.  Assessment/Plan Active Problems:   Acute on chronic respiratory failure with hypoxia (HCC)   COPD, severe (HCC)   Churg-Strauss syndrome with lung involvement (HCC)   MSSA (methicillin susceptible Staphylococcus aureus) pneumonia (HCC)   1. Acute on chronic respiratory failure with hypoxia patient is on 35% FiO2 satting well at this time continue supportive measures and pulmonary toilet. 2. Severe COPD nebulizers as needed prognosis quite guarded 3. Churg-Strauss at baseline we will continue with supportive care and prophylactic antibiotics 4. MSSA pneumonia treated we will continue with supportive care has been treated with antibiotics   I have personally seen and evaluated the patient, evaluated laboratory and imaging results, formulated the assessment and plan and placed orders. The Patient requires high complexity decision making with multiple systems involvement.  Rounds were done with the Respiratory Therapy Director and Staff therapists and discussed with nursing staff also.  Yevonne Pax, MD [facility] Pulmonary Critical Care Medicine Sleep Medicine

## 2020-03-10 DIAGNOSIS — M301 Polyarteritis with lung involvement [Churg-Strauss]: Secondary | ICD-10-CM | POA: Diagnosis not present

## 2020-03-10 DIAGNOSIS — J9621 Acute and chronic respiratory failure with hypoxia: Secondary | ICD-10-CM | POA: Diagnosis not present

## 2020-03-10 DIAGNOSIS — J15211 Pneumonia due to Methicillin susceptible Staphylococcus aureus: Secondary | ICD-10-CM | POA: Diagnosis not present

## 2020-03-10 DIAGNOSIS — J449 Chronic obstructive pulmonary disease, unspecified: Secondary | ICD-10-CM | POA: Diagnosis not present

## 2020-03-10 NOTE — Progress Notes (Signed)
Pulmonary Critical Care Medicine [facility] [place]   PULMONARY CRITICAL CARE SERVICE  PROGRESS NOTE  Date of Service: 03/10/2020  Vincent Archer  [id number]  DOB: [birth year]   DOA: 02/28/2020  Referring Physician: Carron Curie, MD  HPI: Vincent Archer is a 79 y.o. male seen for follow up of Acute on Chronic Respiratory Failure.  He is on 35% FiO2 on the face tent seems to be tolerating it well with good saturations  Medications: Reviewed on Rounds  Physical Exam:  Vitals: Temperature is 97.6 pulse 99 respiratory rate 26 blood pressure is 112/66 saturations 95%  Ventilator Settings off the ventilator on the facemask on 35%  . General: Comfortable at this time . Eyes: Grossly normal lids, irises & conjunctiva . ENT: grossly tongue is normal . Neck: no obvious mass . Cardiovascular: S1 S2 normal no gallop . Respiratory: Scattered rhonchi expansion is equal . Abdomen: soft . Skin: no rash seen on limited exam . Musculoskeletal: not rigid . Psychiatric:unable to assess . Neurologic: no seizure no involuntary movements         Lab Data:   Basic Metabolic Panel: Recent Labs  Lab 03/04/20 0631 03/07/20 0558 03/08/20 0458  NA 139 146* 142  K 4.6 4.4 4.6  CL 91* 96* 93*  CO2 40* 41* 42*  GLUCOSE 118* 125* 108*  BUN 41* 51* 45*  CREATININE 0.91 0.79 0.81  CALCIUM 9.5 9.4 9.3  MG 2.2 2.1  --   PHOS 4.8* 3.4  --     ABG: No results for input(s): PHART, PCO2ART, PO2ART, HCO3, O2SAT in the last 168 hours.  Liver Function Tests: Recent Labs  Lab 03/04/20 0631  ALBUMIN 2.8*   No results for input(s): LIPASE, AMYLASE in the last 168 hours. No results for input(s): AMMONIA in the last 168 hours.  CBC: Recent Labs  Lab 03/04/20 0631 03/07/20 0558  WBC 7.7 9.0  HGB 9.7* 10.0*  HCT 31.0* 33.0*  MCV 98.4 101.2*  PLT 353 352    Cardiac Enzymes: No results for input(s): CKTOTAL, CKMB, CKMBINDEX, TROPONINI in the last 168 hours.  BNP (last 3 results) No  results for input(s): BNP in the last 8760 hours.  ProBNP (last 3 results) No results for input(s): PROBNP in the last 8760 hours.  Radiological Exams: No results found.  Assessment/Plan Active Problems:   Acute on chronic respiratory failure with hypoxia (HCC)   COPD, severe (HCC)   Churg-Strauss syndrome with lung involvement (HCC)   MSSA (methicillin susceptible Staphylococcus aureus) pneumonia (HCC)   1. Acute on chronic respiratory failure hypoxia plan is to continue with full support right now titrate oxygen down again try nasal cannula if at all possible 2. Severe COPD advanced disease nebulizers as needed 3. MSSA pneumonia treated continue with supportive care 4. Churg-Strauss at baseline we will continue to follow   I have personally seen and evaluated the patient, evaluated laboratory and imaging results, formulated the assessment and plan and placed orders. The Patient requires high complexity decision making with multiple systems involvement.  Rounds were done with the Respiratory Therapy Director and Staff therapists and discussed with nursing staff also.  Yevonne Pax, MD [facility] Pulmonary Critical Care Medicine Sleep Medicine

## 2020-03-11 DIAGNOSIS — J15211 Pneumonia due to Methicillin susceptible Staphylococcus aureus: Secondary | ICD-10-CM | POA: Diagnosis not present

## 2020-03-11 DIAGNOSIS — M301 Polyarteritis with lung involvement [Churg-Strauss]: Secondary | ICD-10-CM | POA: Diagnosis not present

## 2020-03-11 DIAGNOSIS — J449 Chronic obstructive pulmonary disease, unspecified: Secondary | ICD-10-CM | POA: Diagnosis not present

## 2020-03-11 DIAGNOSIS — J9621 Acute and chronic respiratory failure with hypoxia: Secondary | ICD-10-CM | POA: Diagnosis not present

## 2020-03-11 NOTE — Progress Notes (Signed)
Pulmonary Critical Care Medicine [facility] [place]   PULMONARY CRITICAL CARE SERVICE  PROGRESS NOTE  Date of Service: 03/11/2020  Vincent Archer  WER:[id number]  DOB: Jul 10, 1941   DOA: 02/28/2020  Referring Physician: Carron Curie, MD  HPI: Vincent Archer is a 79 y.o. male seen for follow up of Acute on Chronic Respiratory Failure.  Patient remains on facemask has been on 35% FiO2 good saturations  Medications: Reviewed on Rounds  Physical Exam:  Vitals: Temperature is 97.9 pulse 98 respiratory rate 28 blood pressure is 115/66 saturations 94%  Ventilator Settings on facemask FiO2 35%  . General: Comfortable at this time . Eyes: Grossly normal lids, irises & conjunctiva . ENT: grossly tongue is normal . Neck: no obvious mass . Cardiovascular: S1 S2 normal no gallop . Respiratory: No rhonchi no rales are noted . Abdomen: soft . Skin: no rash seen on limited exam . Musculoskeletal: not rigid . Psychiatric:unable to assess . Neurologic: no seizure no involuntary movements         Lab Data:   Basic Metabolic Panel: Recent Labs  Lab 03/07/20 0558 03/08/20 0458  NA 146* 142  K 4.4 4.6  CL 96* 93*  CO2 41* 42*  GLUCOSE 125* 108*  BUN 51* 45*  CREATININE 0.79 0.81  CALCIUM 9.4 9.3  MG 2.1  --   PHOS 3.4  --     ABG: No results for input(s): PHART, PCO2ART, PO2ART, HCO3, O2SAT in the last 168 hours.  Liver Function Tests: No results for input(s): AST, ALT, ALKPHOS, BILITOT, PROT, ALBUMIN in the last 168 hours. No results for input(s): LIPASE, AMYLASE in the last 168 hours. No results for input(s): AMMONIA in the last 168 hours.  CBC: Recent Labs  Lab 03/07/20 0558  WBC 9.0  HGB 10.0*  HCT 33.0*  MCV 101.2*  PLT 352    Cardiac Enzymes: No results for input(s): CKTOTAL, CKMB, CKMBINDEX, TROPONINI in the last 168 hours.  BNP (last 3 results) No results for input(s): BNP in the last 8760 hours.  ProBNP (last 3 results) No results for input(s):  PROBNP in the last 8760 hours.  Radiological Exams: No results found.  Assessment/Plan Active Problems:   Acute on chronic respiratory failure with hypoxia (HCC)   COPD, severe (HCC)   Churg-Strauss syndrome with lung involvement (HCC)   MSSA (methicillin susceptible Staphylococcus aureus) pneumonia (HCC)   1. Acute on chronic respiratory failure hypoxia we will continue with oxygen titrate down as tolerated 2. Severe COPD at baseline continue supportive care 3. Churg-Strauss at baseline 4. MSSA pneumonia treated clinically seems unchanged we will monitor radiologically   I have personally seen and evaluated the patient, evaluated laboratory and imaging results, formulated the assessment and plan and placed orders. The Patient requires high complexity decision making with multiple systems involvement.  Rounds were done with the Respiratory Therapy Director and Staff therapists and discussed with nursing staff also.  Yevonne Pax, MD [facility] Pulmonary Critical Care Medicine Sleep Medicine

## 2020-03-12 DIAGNOSIS — M301 Polyarteritis with lung involvement [Churg-Strauss]: Secondary | ICD-10-CM | POA: Diagnosis not present

## 2020-03-12 DIAGNOSIS — J9621 Acute and chronic respiratory failure with hypoxia: Secondary | ICD-10-CM | POA: Diagnosis not present

## 2020-03-12 DIAGNOSIS — J449 Chronic obstructive pulmonary disease, unspecified: Secondary | ICD-10-CM | POA: Diagnosis not present

## 2020-03-12 DIAGNOSIS — J15211 Pneumonia due to Methicillin susceptible Staphylococcus aureus: Secondary | ICD-10-CM | POA: Diagnosis not present

## 2020-03-12 LAB — BASIC METABOLIC PANEL
Anion gap: 6 (ref 5–15)
BUN: 46 mg/dL — ABNORMAL HIGH (ref 8–23)
CO2: 46 mmol/L — ABNORMAL HIGH (ref 22–32)
Calcium: 9.3 mg/dL (ref 8.9–10.3)
Chloride: 91 mmol/L — ABNORMAL LOW (ref 98–111)
Creatinine, Ser: 0.94 mg/dL (ref 0.61–1.24)
GFR calc Af Amer: >60 mL/min (ref >60)
GFR calc non Af Amer: >60 mL/min (ref >60)
Glucose, Bld: 131 mg/dL — ABNORMAL HIGH (ref 70–99)
Potassium: 4.5 mmol/L (ref 3.5–5.1)
Sodium: 143 mmol/L (ref 135–145)

## 2020-03-12 LAB — CULTURE, RESPIRATORY W GRAM STAIN

## 2020-03-12 LAB — CBC
HCT: 29.9 % — ABNORMAL LOW (ref 39.0–52.0)
Hemoglobin: 8.9 g/dL — ABNORMAL LOW (ref 13.0–17.0)
MCH: 30.4 pg (ref 26.0–34.0)
MCHC: 29.8 g/dL — ABNORMAL LOW (ref 30.0–36.0)
MCV: 102 fL — ABNORMAL HIGH (ref 80.0–100.0)
Platelets: 306 10*3/uL (ref 150–400)
RBC: 2.93 MIL/uL — ABNORMAL LOW (ref 4.22–5.81)
RDW: 13.5 % (ref 11.5–15.5)
WBC: 9.3 10*3/uL (ref 4.0–10.5)
nRBC: 0 % (ref 0.0–0.2)

## 2020-03-12 LAB — MAGNESIUM: Magnesium: 2.2 mg/dL (ref 1.7–2.4)

## 2020-03-12 NOTE — Progress Notes (Signed)
Pulmonary Critical Care Medicine [facility] [place]   PULMONARY CRITICAL CARE SERVICE  PROGRESS NOTE  Date of Service: 03/12/2020  Vincent Archer  [id number]:[id number]  DOB: 09/10/1941   DOA: 02/28/2020  Referring Physician: Carron Curie, MD  HPI: Vincent Archer is a 79 y.o. male seen for follow up of Acute on Chronic Respiratory Failure.  Remains on facemask patient continues to state that he is not getting enough oxygen.  He however does not want anything on his face his first cannula etc.  Spoke with primary care team regarding options other than face tent and facemask we really do not have much other options that we can offer at this time besides high flow and possible need for tracheostomy.  Medications: Reviewed on Rounds  Physical Exam:  Vitals: Temperature is 97.1 pulse 106 respiratory 26 blood pressure is 131/81 saturations 93%  Ventilator Settings on facemask  . General: Comfortable at this time . Eyes: Grossly normal lids, irises & conjunctiva . ENT: grossly tongue is normal . Neck: no obvious mass . Cardiovascular: S1 S2 normal no gallop . Respiratory: No rhonchi coarse breath sounds . Abdomen: soft . Skin: no rash seen on limited exam . Musculoskeletal: not rigid . Psychiatric:unable to assess . Neurologic: no seizure no involuntary movements         Lab Data:   Basic Metabolic Panel: Recent Labs  Lab 03/07/20 0558 03/08/20 0458 03/12/20 0537  NA 146* 142 143  K 4.4 4.6 4.5  CL 96* 93* 91*  CO2 41* 42* 46*  GLUCOSE 125* 108* 131*  BUN 51* 45* 46*  CREATININE 0.79 0.81 0.94  CALCIUM 9.4 9.3 9.3  MG 2.1  --  2.2  PHOS 3.4  --   --     ABG: No results for input(s): PHART, PCO2ART, PO2ART, HCO3, O2SAT in the last 168 hours.  Liver Function Tests: No results for input(s): AST, ALT, ALKPHOS, BILITOT, PROT, ALBUMIN in the last 168 hours. No results for input(s): LIPASE, AMYLASE in the last 168 hours. No results for input(s): AMMONIA in the last 168  hours.  CBC: Recent Labs  Lab 03/07/20 0558 03/12/20 0537  WBC 9.0 9.3  HGB 10.0* 8.9*  HCT 33.0* 29.9*  MCV 101.2* 102.0*  PLT 352 306    Cardiac Enzymes: No results for input(s): CKTOTAL, CKMB, CKMBINDEX, TROPONINI in the last 168 hours.  BNP (last 3 results) No results for input(s): BNP in the last 8760 hours.  ProBNP (last 3 results) No results for input(s): PROBNP in the last 8760 hours.  Radiological Exams: No results found.  Assessment/Plan Active Problems:   Acute on chronic respiratory failure with hypoxia (HCC)   COPD, severe (HCC)   Churg-Strauss syndrome with lung involvement (HCC)   MSSA (methicillin susceptible Staphylococcus aureus) pneumonia (HCC)   1. Acute on chronic respiratory failure hypoxia plan is to continue with supportive care titrate oxygen as tolerated according to patient's needs. 2. Severe COPD advanced disease patient is on nebulizers which will be continued. 3. Churg-Strauss on steroids which were recently increased 4. MSSA pneumonia treated we will continue with supportive care   I have personally seen and evaluated the patient, evaluated laboratory and imaging results, formulated the assessment and plan and placed orders. The Patient requires high complexity decision making with multiple systems involvement.  Rounds were done with the Respiratory Therapy Director and Staff therapists and discussed with nursing staff also.  Yevonne Pax, MD [facility] Pulmonary Critical Care Medicine Sleep Medicine

## 2020-03-13 ENCOUNTER — Other Ambulatory Visit (HOSPITAL_COMMUNITY): Payer: Federal, State, Local not specified - PPO

## 2020-03-13 DIAGNOSIS — J449 Chronic obstructive pulmonary disease, unspecified: Secondary | ICD-10-CM | POA: Diagnosis not present

## 2020-03-13 DIAGNOSIS — J9621 Acute and chronic respiratory failure with hypoxia: Secondary | ICD-10-CM | POA: Diagnosis not present

## 2020-03-13 DIAGNOSIS — M301 Polyarteritis with lung involvement [Churg-Strauss]: Secondary | ICD-10-CM | POA: Diagnosis not present

## 2020-03-13 DIAGNOSIS — J15211 Pneumonia due to Methicillin susceptible Staphylococcus aureus: Secondary | ICD-10-CM | POA: Diagnosis not present

## 2020-03-13 NOTE — Progress Notes (Signed)
Pulmonary Critical Care Medicine [facility] [place]   PULMONARY CRITICAL CARE SERVICE  PROGRESS NOTE  Date of Service: 03/13/2020  Vincent Archer  [id number]:[id number]  DOB: June 03, 1941   DOA: 02/28/2020  Referring Physician: Carron Curie, MD  HPI: Vincent Archer is a 79 y.o. male seen for follow up of Acute on Chronic Respiratory Failure.  Remains on facemask has been on 35% FiO2.  The problem is the patient is usually taking the facemask off and then has a episodes of desaturation  Medications: Reviewed on Rounds  Physical Exam:  Vitals: Temperature is 98.2 pulse 94 respiratory rate 26 blood pressure is 111/67 saturations 93%  Ventilator Settings facemask FiO2 35%  . General: Comfortable at this time . Eyes: Grossly normal lids, irises & conjunctiva . ENT: grossly tongue is normal . Neck: no obvious mass . Cardiovascular: S1 S2 normal no gallop . Respiratory: No rhonchi coarse breath sounds . Abdomen: soft . Skin: no rash seen on limited exam . Musculoskeletal: not rigid . Psychiatric:unable to assess . Neurologic: no seizure no involuntary movements         Lab Data:   Basic Metabolic Panel: Recent Labs  Lab 03/07/20 0558 03/08/20 0458 03/12/20 0537  NA 146* 142 143  K 4.4 4.6 4.5  CL 96* 93* 91*  CO2 41* 42* 46*  GLUCOSE 125* 108* 131*  BUN 51* 45* 46*  CREATININE 0.79 0.81 0.94  CALCIUM 9.4 9.3 9.3  MG 2.1  --  2.2  PHOS 3.4  --   --     ABG: No results for input(s): PHART, PCO2ART, PO2ART, HCO3, O2SAT in the last 168 hours.  Liver Function Tests: No results for input(s): AST, ALT, ALKPHOS, BILITOT, PROT, ALBUMIN in the last 168 hours. No results for input(s): LIPASE, AMYLASE in the last 168 hours. No results for input(s): AMMONIA in the last 168 hours.  CBC: Recent Labs  Lab 03/07/20 0558 03/12/20 0537  WBC 9.0 9.3  HGB 10.0* 8.9*  HCT 33.0* 29.9*  MCV 101.2* 102.0*  PLT 352 306    Cardiac Enzymes: No results for input(s): CKTOTAL, CKMB,  CKMBINDEX, TROPONINI in the last 168 hours.  BNP (last 3 results) No results for input(s): BNP in the last 8760 hours.  ProBNP (last 3 results) No results for input(s): PROBNP in the last 8760 hours.  Radiological Exams: No results found.  Assessment/Plan Active Problems:   Acute on chronic respiratory failure with hypoxia (HCC)   COPD, severe (HCC)   Churg-Strauss syndrome with lung involvement (HCC)   MSSA (methicillin susceptible Staphylococcus aureus) pneumonia (HCC)   1. Acute on chronic respiratory failure with hypoxia plan is to continue with facemask as tolerated titrate oxygen as tolerated 2. Severe COPD at baseline continue with present management 3. Churg-Strauss syndrome at baseline on steroids 4. MSSA pneumonia treated clinically improving we will follow   I have personally seen and evaluated the patient, evaluated laboratory and imaging results, formulated the assessment and plan and placed orders. The Patient requires high complexity decision making with multiple systems involvement.  Rounds were done with the Respiratory Therapy Director and Staff therapists and discussed with nursing staff also.  Yevonne Pax, MD [facility] Pulmonary Critical Care Medicine Sleep Medicine

## 2020-03-14 DIAGNOSIS — J15211 Pneumonia due to Methicillin susceptible Staphylococcus aureus: Secondary | ICD-10-CM | POA: Diagnosis not present

## 2020-03-14 DIAGNOSIS — M301 Polyarteritis with lung involvement [Churg-Strauss]: Secondary | ICD-10-CM | POA: Diagnosis not present

## 2020-03-14 DIAGNOSIS — J449 Chronic obstructive pulmonary disease, unspecified: Secondary | ICD-10-CM | POA: Diagnosis not present

## 2020-03-14 DIAGNOSIS — J9621 Acute and chronic respiratory failure with hypoxia: Secondary | ICD-10-CM | POA: Diagnosis not present

## 2020-03-14 NOTE — Progress Notes (Signed)
Pulmonary Critical Care Medicine [facility] [place]   PULMONARY CRITICAL CARE SERVICE  PROGRESS NOTE  Date of Service: 03/14/2020  Vincent Archer  [id number]  DOB: [birth year]   DOA: 02/28/2020  Referring Physician: Carron Curie, MD  HPI: Vincent Archer is a 79 y.o. male seen for follow up of Acute on Chronic Respiratory Failure.  Patient has baseline right now on 35% FiO2  Medications: Reviewed on Rounds  Physical Exam:  Vitals: Temperature is 97.2 pulse 114 respiratory 21 blood pressure is 109/62 saturations 96%  Ventilator Settings on 35% facemask  . General: Comfortable at this time . Eyes: Grossly normal lids, irises & conjunctiva . ENT: grossly tongue is normal . Neck: no obvious mass . Cardiovascular: S1 S2 normal no gallop . Respiratory: No rhonchi coarse breath sounds . Abdomen: soft . Skin: no rash seen on limited exam . Musculoskeletal: not rigid . Psychiatric:unable to assess . Neurologic: no seizure no involuntary movements         Lab Data:   Basic Metabolic Panel: Recent Labs  Lab 03/08/20 0458 03/12/20 0537  NA 142 143  K 4.6 4.5  CL 93* 91*  CO2 42* 46*  GLUCOSE 108* 131*  BUN 45* 46*  CREATININE 0.81 0.94  CALCIUM 9.3 9.3  MG  --  2.2    ABG: No results for input(s): PHART, PCO2ART, PO2ART, HCO3, O2SAT in the last 168 hours.  Liver Function Tests: No results for input(s): AST, ALT, ALKPHOS, BILITOT, PROT, ALBUMIN in the last 168 hours. No results for input(s): LIPASE, AMYLASE in the last 168 hours. No results for input(s): AMMONIA in the last 168 hours.  CBC: Recent Labs  Lab 03/12/20 0537  WBC 9.3  HGB 8.9*  HCT 29.9*  MCV 102.0*  PLT 306    Cardiac Enzymes: No results for input(s): CKTOTAL, CKMB, CKMBINDEX, TROPONINI in the last 168 hours.  BNP (last 3 results) No results for input(s): BNP in the last 8760 hours.  ProBNP (last 3 results) No results for input(s): PROBNP in the last 8760 hours.  Radiological  Exams: DG CHEST PORT 1 VIEW  Result Date: 03/13/2020 CLINICAL DATA:  Increased tracheal secretions EXAM: PORTABLE CHEST 1 VIEW COMPARISON:  None. FINDINGS: Normal mediastinum and cardiac silhouette. Lungs are hyperinflated. No effusion, infiltrate or pneumothorax. Mild bronchitic markings. IMPRESSION: Hyperinflated lungs.  No acute findings. Electronically Signed   By: Genevive Bi M.D.   On: 03/13/2020 08:28    Assessment/Plan Active Problems:   Acute on chronic respiratory failure with hypoxia (HCC)   COPD, severe (HCC)   Churg-Strauss syndrome with lung involvement (HCC)   MSSA (methicillin susceptible Staphylococcus aureus) pneumonia (HCC)   1. Acute on chronic respiratory failure with hypoxia we will continue with facemasks titrate oxygen continue pulmonary toilet 2. Severe COPD at baseline we will continue to follow nebulizers as needed 3. Churg-Strauss on steroids 4. MSSA pneumonia treated clinically improved   I have personally seen and evaluated the patient, evaluated laboratory and imaging results, formulated the assessment and plan and placed orders. The Patient requires high complexity decision making with multiple systems involvement.  Rounds were done with the Respiratory Therapy Director and Staff therapists and discussed with nursing staff also.  Yevonne Pax, MD [facility] Pulmonary Critical Care Medicine Sleep Medicine

## 2020-03-15 DIAGNOSIS — J449 Chronic obstructive pulmonary disease, unspecified: Secondary | ICD-10-CM | POA: Diagnosis not present

## 2020-03-15 DIAGNOSIS — J15211 Pneumonia due to Methicillin susceptible Staphylococcus aureus: Secondary | ICD-10-CM | POA: Diagnosis not present

## 2020-03-15 DIAGNOSIS — M301 Polyarteritis with lung involvement [Churg-Strauss]: Secondary | ICD-10-CM | POA: Diagnosis not present

## 2020-03-15 DIAGNOSIS — J9621 Acute and chronic respiratory failure with hypoxia: Secondary | ICD-10-CM | POA: Diagnosis not present

## 2020-03-15 LAB — CBC
HCT: 27.8 % — ABNORMAL LOW (ref 39.0–52.0)
Hemoglobin: 8.4 g/dL — ABNORMAL LOW (ref 13.0–17.0)
MCH: 30 pg (ref 26.0–34.0)
MCHC: 30.2 g/dL (ref 30.0–36.0)
MCV: 99.3 fL (ref 80.0–100.0)
Platelets: 266 10*3/uL (ref 150–400)
RBC: 2.8 MIL/uL — ABNORMAL LOW (ref 4.22–5.81)
RDW: 14.2 % (ref 11.5–15.5)
WBC: 7.4 10*3/uL (ref 4.0–10.5)
nRBC: 0 % (ref 0.0–0.2)

## 2020-03-15 LAB — BASIC METABOLIC PANEL
Anion gap: 5 (ref 5–15)
BUN: 51 mg/dL — ABNORMAL HIGH (ref 8–23)
CO2: 42 mmol/L — ABNORMAL HIGH (ref 22–32)
Calcium: 9.2 mg/dL (ref 8.9–10.3)
Chloride: 92 mmol/L — ABNORMAL LOW (ref 98–111)
Creatinine, Ser: 0.87 mg/dL (ref 0.61–1.24)
GFR calc Af Amer: >60 mL/min (ref >60)
GFR calc non Af Amer: >60 mL/min (ref >60)
Glucose, Bld: 121 mg/dL — ABNORMAL HIGH (ref 70–99)
Potassium: 4.2 mmol/L (ref 3.5–5.1)
Sodium: 139 mmol/L (ref 135–145)

## 2020-03-15 LAB — PHOSPHORUS: Phosphorus: 3.6 mg/dL (ref 2.5–4.6)

## 2020-03-15 LAB — VANCOMYCIN, TROUGH: Vancomycin Tr: <4 ug/mL — ABNORMAL LOW (ref 15–20)

## 2020-03-15 LAB — MAGNESIUM: Magnesium: 2.1 mg/dL (ref 1.7–2.4)

## 2020-03-15 NOTE — Progress Notes (Signed)
Pulmonary Critical Care Medicine [facility] [place]   PULMONARY CRITICAL CARE SERVICE  PROGRESS NOTE  Date of Service: 03/15/2020  Vincent Archer  MOQ:[id number]  DOB: Mar 18, 1941   DOA: 02/28/2020  Referring Physician: Carron Curie, MD  HPI: Vincent Archer is a 79 y.o. male seen for follow up of Acute on Chronic Respiratory Failure.  Patient at this time is on facemask has been on 35% FiO2 with good saturations  Medications: Reviewed on Rounds  Physical Exam:  Vitals: Temperature is 97.9 pulse 97 respiratory 24 blood pressure is 104/65 saturations 96%  Ventilator Settings on 35% FiO2 facemask  . General: Comfortable at this time . Eyes: Grossly normal lids, irises & conjunctiva . ENT: grossly tongue is normal . Neck: no obvious mass . Cardiovascular: S1 S2 normal no gallop . Respiratory: No rhonchi no rales are noted at this time . Abdomen: soft . Skin: no rash seen on limited exam . Musculoskeletal: not rigid . Psychiatric:unable to assess . Neurologic: no seizure no involuntary movements         Lab Data:   Basic Metabolic Panel: Recent Labs  Lab 03/12/20 0537 03/15/20 0517  NA 143 139  K 4.5 4.2  CL 91* 92*  CO2 46* 42*  GLUCOSE 131* 121*  BUN 46* 51*  CREATININE 0.94 0.87  CALCIUM 9.3 9.2  MG 2.2 2.1  PHOS  --  3.6    ABG: No results for input(s): PHART, PCO2ART, PO2ART, HCO3, O2SAT in the last 168 hours.  Liver Function Tests: No results for input(s): AST, ALT, ALKPHOS, BILITOT, PROT, ALBUMIN in the last 168 hours. No results for input(s): LIPASE, AMYLASE in the last 168 hours. No results for input(s): AMMONIA in the last 168 hours.  CBC: Recent Labs  Lab 03/12/20 0537 03/15/20 0517  WBC 9.3 7.4  HGB 8.9* 8.4*  HCT 29.9* 27.8*  MCV 102.0* 99.3  PLT 306 266    Cardiac Enzymes: No results for input(s): CKTOTAL, CKMB, CKMBINDEX, TROPONINI in the last 168 hours.  BNP (last 3 results) No results for input(s): BNP in the last 8760  hours.  ProBNP (last 3 results) No results for input(s): PROBNP in the last 8760 hours.  Radiological Exams: No results found.  Assessment/Plan Active Problems:   Acute on chronic respiratory failure with hypoxia (HCC)   COPD, severe (HCC)   Churg-Strauss syndrome with lung involvement (HCC)   MSSA (methicillin susceptible Staphylococcus aureus) pneumonia (HCC)   1. Acute on chronic respiratory failure with hypoxia plan is to continue with 35% FiO2 titrate down as tolerated. 2. Severe COPD at baseline we will continue with supportive care 3. 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 MSSA pneumonia treated clinically improving we will monitor   I have personally seen and evaluated the patient, evaluated laboratory and imaging results, formulated the assessment and plan and placed orders. The Patient requires high complexity decision making with multiple systems involvement.  Rounds were done with the Respiratory Therapy Director and Staff therapists and discussed with nursing staff also.  Yevonne Pax, MD [facility] Pulmonary Critical Care Medicine Sleep Medicine

## 2020-03-16 DIAGNOSIS — J449 Chronic obstructive pulmonary disease, unspecified: Secondary | ICD-10-CM | POA: Diagnosis not present

## 2020-03-16 DIAGNOSIS — J15211 Pneumonia due to Methicillin susceptible Staphylococcus aureus: Secondary | ICD-10-CM | POA: Diagnosis not present

## 2020-03-16 DIAGNOSIS — M301 Polyarteritis with lung involvement [Churg-Strauss]: Secondary | ICD-10-CM | POA: Diagnosis not present

## 2020-03-16 DIAGNOSIS — J9621 Acute and chronic respiratory failure with hypoxia: Secondary | ICD-10-CM | POA: Diagnosis not present

## 2020-03-16 LAB — SARS CORONAVIRUS 2 (TAT 6-24 HRS): SARS Coronavirus 2: NEGATIVE

## 2020-03-16 NOTE — Progress Notes (Signed)
Pulmonary Critical Care Medicine [facility] [place]   PULMONARY CRITICAL CARE SERVICE  PROGRESS NOTE  Date of Service: 03/16/2020  Vincent Archer  [id number]:[id number]  DOB: 09/04/1941   DOA: 02/28/2020  Referring Physician: Carron Curie, MD  HPI: Vincent Archer is a 79 y.o. male seen for follow up of Acute on Chronic Respiratory Failure.  Patient currently is on facemask 35% FiO2  Medications: Reviewed on Rounds  Physical Exam:  Vitals: Temperature is 96.6 pulse 97 respiratory rate 30 blood pressure is 129/71 saturations 96%  Ventilator Settings on 35% facemask  . General: Comfortable at this time . Eyes: Grossly normal lids, irises & conjunctiva . ENT: grossly tongue is normal . Neck: no obvious mass . Cardiovascular: S1 S2 normal no gallop . Respiratory: No rhonchi coarse breath sounds . Abdomen: soft . Skin: no rash seen on limited exam . Musculoskeletal: not rigid . Psychiatric:unable to assess . Neurologic: no seizure no involuntary movements         Lab Data:   Basic Metabolic Panel: Recent Labs  Lab 03/12/20 0537 03/15/20 0517  NA 143 139  K 4.5 4.2  CL 91* 92*  CO2 46* 42*  GLUCOSE 131* 121*  BUN 46* 51*  CREATININE 0.94 0.87  CALCIUM 9.3 9.2  MG 2.2 2.1  PHOS  --  3.6    ABG: No results for input(s): PHART, PCO2ART, PO2ART, HCO3, O2SAT in the last 168 hours.  Liver Function Tests: No results for input(s): AST, ALT, ALKPHOS, BILITOT, PROT, ALBUMIN in the last 168 hours. No results for input(s): LIPASE, AMYLASE in the last 168 hours. No results for input(s): AMMONIA in the last 168 hours.  CBC: Recent Labs  Lab 03/12/20 0537 03/15/20 0517  WBC 9.3 7.4  HGB 8.9* 8.4*  HCT 29.9* 27.8*  MCV 102.0* 99.3  PLT 306 266    Cardiac Enzymes: No results for input(s): CKTOTAL, CKMB, CKMBINDEX, TROPONINI in the last 168 hours.  BNP (last 3 results) No results for input(s): BNP in the last 8760 hours.  ProBNP (last 3 results) No results for  input(s): PROBNP in the last 8760 hours.  Radiological Exams: No results found.  Assessment/Plan Active Problems:   Acute on chronic respiratory failure with hypoxia (HCC)   COPD, severe (HCC)   Churg-Strauss syndrome with lung involvement (HCC)   MSSA (methicillin susceptible Staphylococcus aureus) pneumonia (HCC)   1. Acute on chronic respiratory failure hypoxia plan is to continue with oxygen therapy supportive care possible discharge indicated by the case management 2. Severe COPD continue with supportive care nebulizers as necessary 3. Churg-Strauss chronic steroid use 4. MSSA pneumonia treated clinically improved   I have personally seen and evaluated the patient, evaluated laboratory and imaging results, formulated the assessment and plan and placed orders. The Patient requires high complexity decision making with multiple systems involvement.  Rounds were done with the Respiratory Therapy Director and Staff therapists and discussed with nursing staff also.  Yevonne Pax, MD [facility] Pulmonary Critical Care Medicine Sleep Medicine

## 2020-09-11 IMAGING — CT CT ANGIO CHEST
2 of 6 series · 18 of 46 positions shown · IV contrast (omnipaque)
Comparison: None.

CLINICAL DATA: Shortness of breath

EXAM:
CT ANGIOGRAPHY CHEST WITH CONTRAST
TECHNIQUE: Multidetector CT imaging of the chest was performed using the
standard protocol during bolus administration of intravenous
contrast. Multiplanar CT image reconstructions and MIPs were
obtained to evaluate the vascular anatomy.
CONTRAST:  80mL OMNIPAQUE IOHEXOL 350 MG/ML SOLN

[Series 6: thins · axial · 0.70mm/px · z∈[+1416,+1684]mm · 15 of 294 slices shown]
[im 13/294  lung]
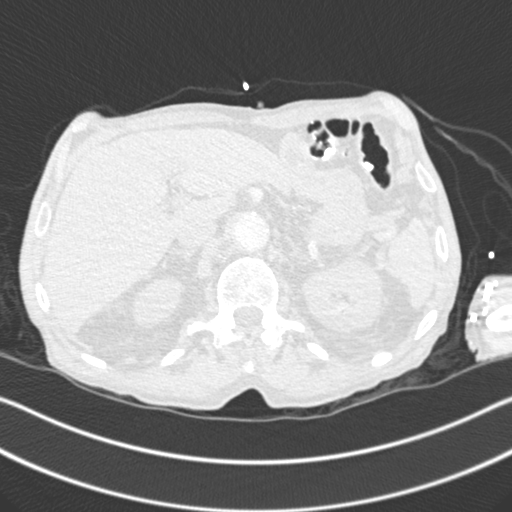
[im 39/294  soft-tissue]
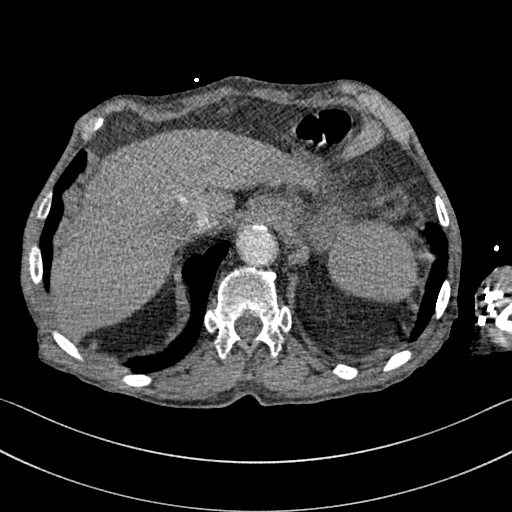
[im 51/294  lung]
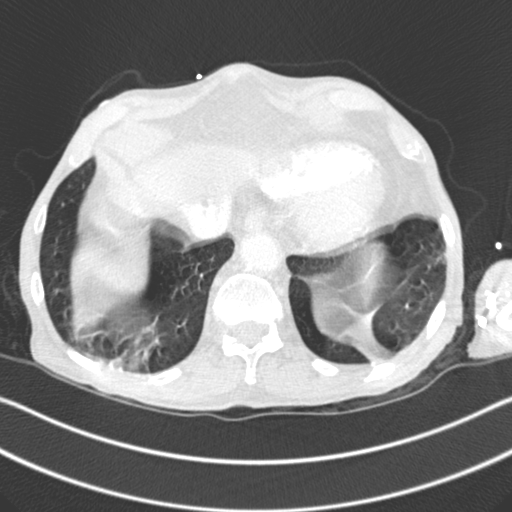
[im 77/294  soft-tissue]
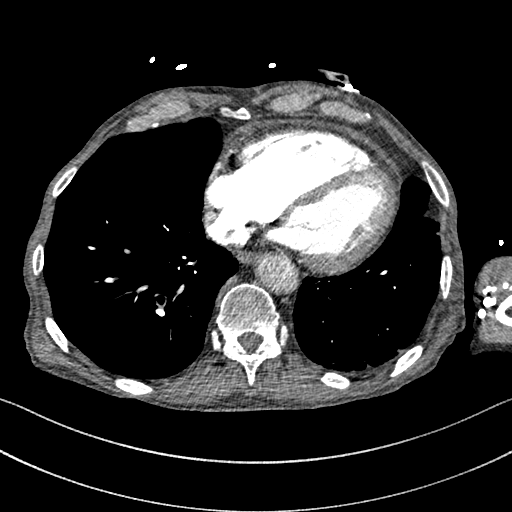
[im 90/294  lung]
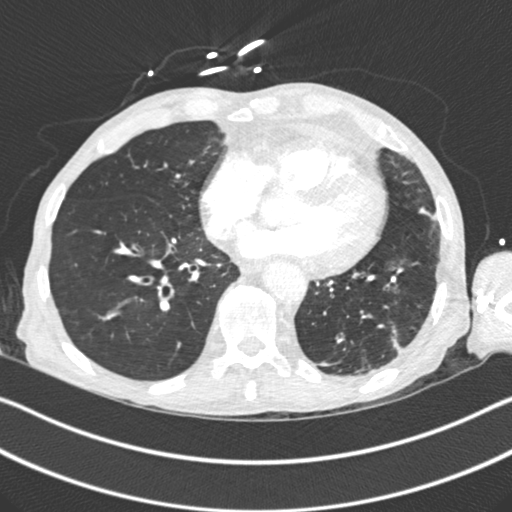
[im 115/294  soft-tissue]
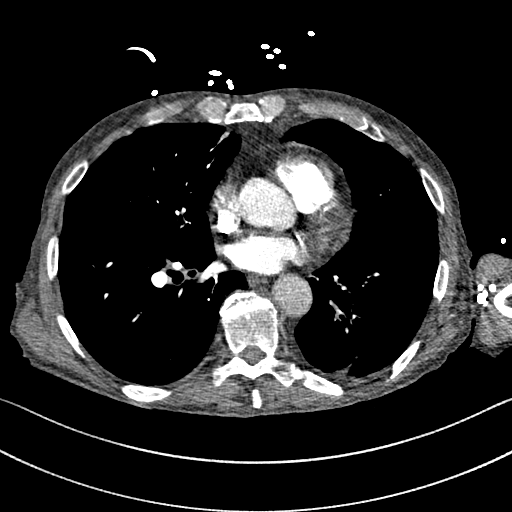
[im 128/294  lung]
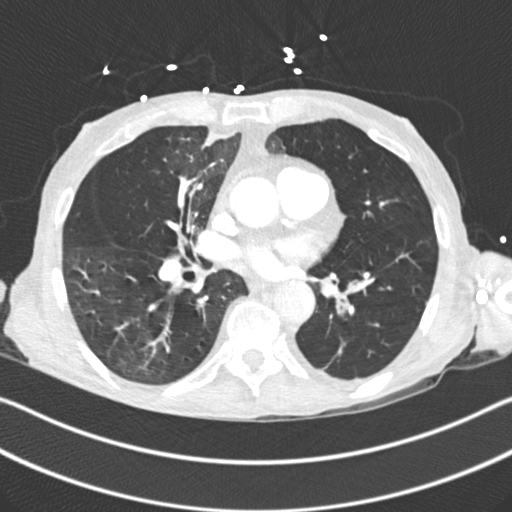
[im 153/294  soft-tissue]
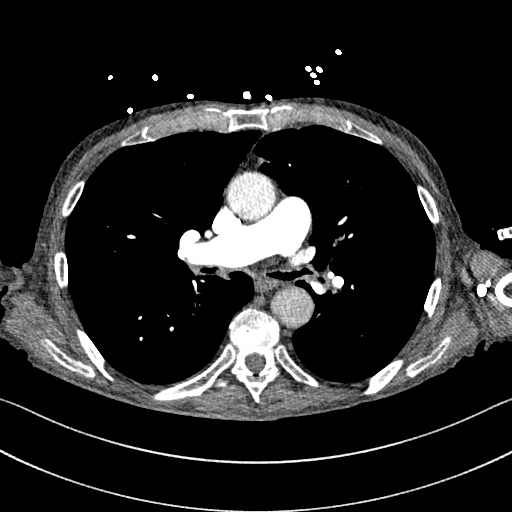
[im 166/294  lung]
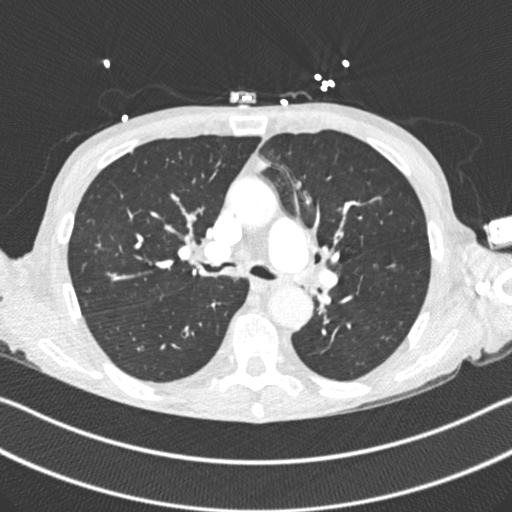
[im 179/294  soft-tissue]
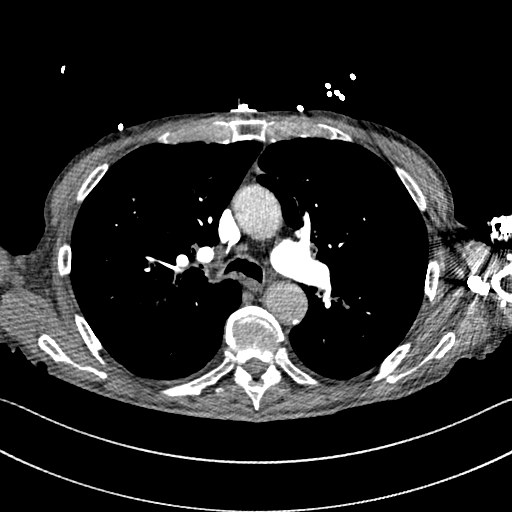
[im 204/294  lung]
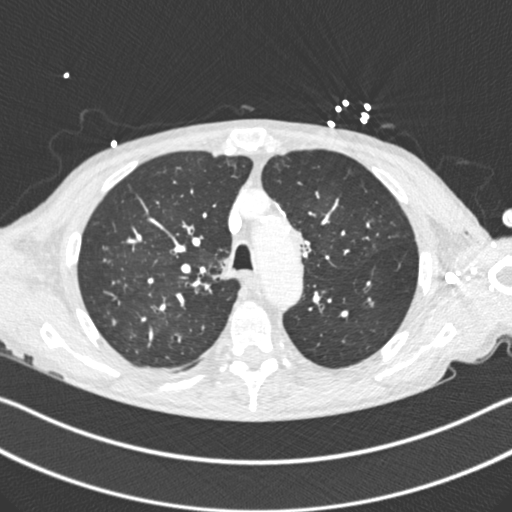
[im 217/294  soft-tissue]
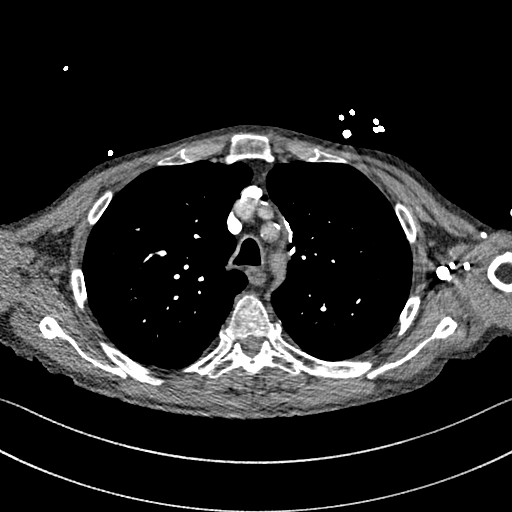
[im 243/294  lung]
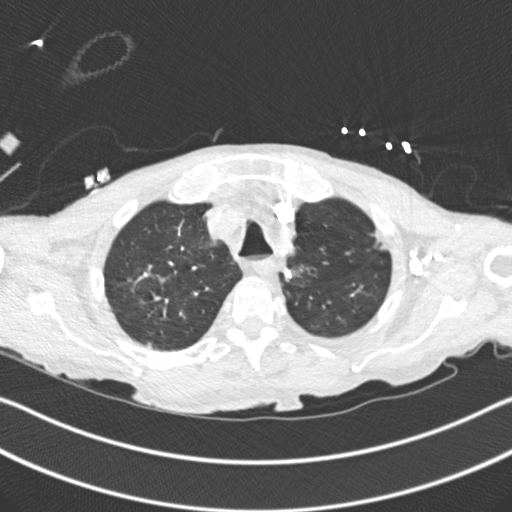
[im 255/294  soft-tissue]
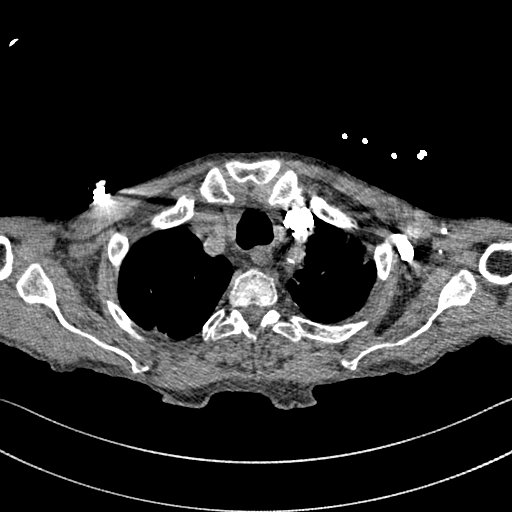
[im 281/294  lung]
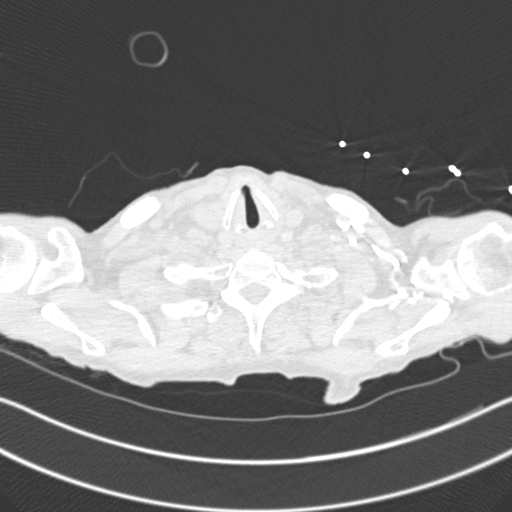

[Series 8: coronal mpr · coronal · 0.59mm/px · 3 of 148 slices shown]
[im 37/148  soft-tissue]
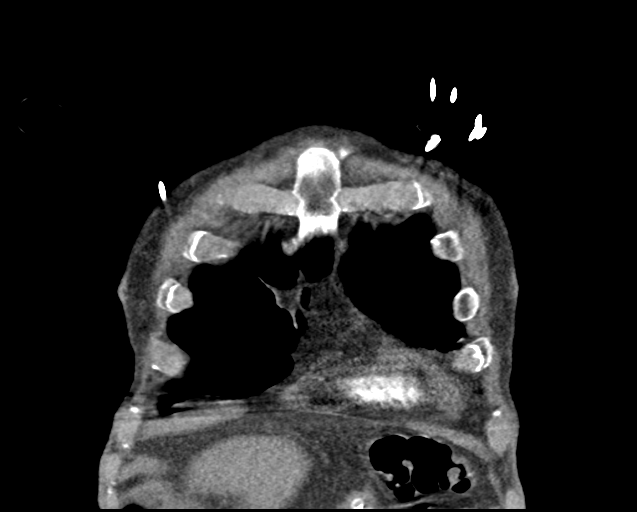
[im 74/148  soft-tissue]
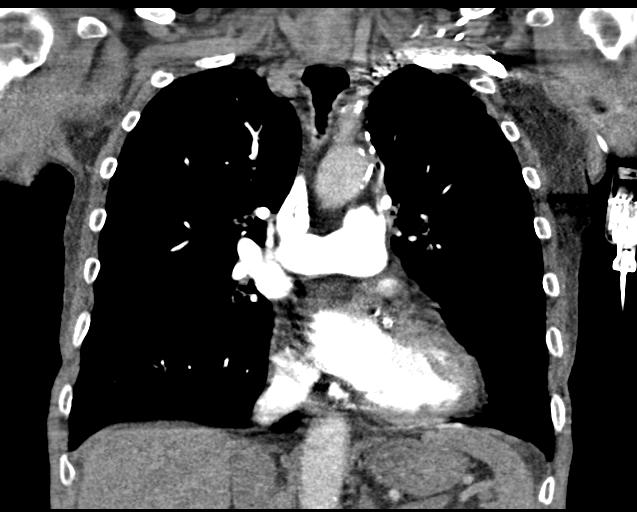
[im 111/148  soft-tissue]
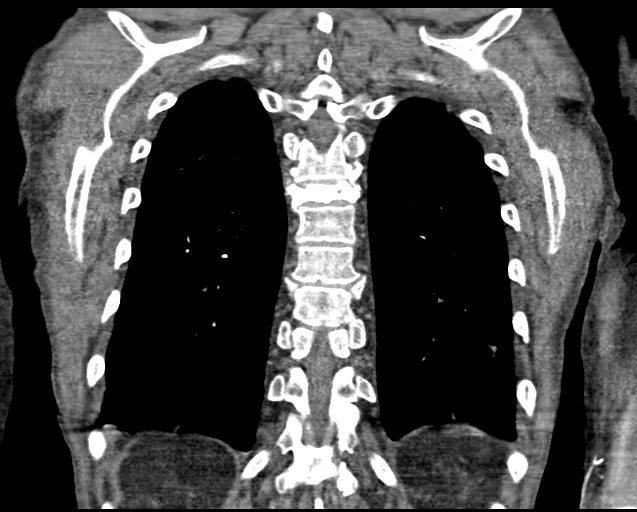

[18 of 46 positions shown; findings below may reference images not displayed]

FINDINGS: Cardiovascular: Contrast injection is sufficient to demonstrate
satisfactory opacification of the pulmonary arteries to the
segmental level. There is no pulmonary embolus. The main pulmonary
artery is within normal limits for size. There are atherosclerotic
changes of the thoracic aorta without evidence for aneurysm. The
heart size is normal. There is a small pericardial effusion.
Coronary artery calcifications are noted.

Mediastinum/Nodes:

--No mediastinal or hilar lymphadenopathy.

--No axillary lymphadenopathy.

--No supraclavicular lymphadenopathy.

--Normal thyroid gland.

--The esophagus is unremarkable

Lungs/Pleura: Extensive bronchial wall thickening and mucus plugging
at the lung bases bilaterally. There are tree in bud airspace
opacities in the right upper lobe. There is bronchiectasis
bilaterally, most evident in the left upper lobe. There is debris
within the right mainstem bronchus and lower trachea. There is no
pneumothorax. There is no large pleural effusion. The lungs are
hyperexpanded. There is atelectasis and scarring at the lung bases.

Upper Abdomen: A percutaneous gastrostomy tube is partially
visualized in the upper abdomen. There is no definite acute
abnormality within the upper abdomen.

Musculoskeletal: There is an age-indeterminate severe compression
fracture of the T6 vertebral body. There is no significant
retropulsion at this level. This fractures favored to be chronic.

Review of the MIP images confirms the above findings.
IMPRESSION: 1. No acute pulmonary embolism.
2. Findings suggestive of infectious or reactive bronchiolitis as
detailed above.
3. Hyperexpanded lungs which can be seen in patients with COPD.
4. Small pericardial effusion.
5. Age-indeterminate compression fracture of the T6 vertebral body,
favored to be chronic. Correlation with physical exam is
recommended.

Aortic Atherosclerosis (OMII5-YPO.O).

## 2020-09-24 IMAGING — DX DG CHEST 1V PORT
1 series · 1 of 1 positions shown · non-contrast
Comparison: None.

CLINICAL DATA: Increased tracheal secretions

EXAM:
PORTABLE CHEST 1 VIEW

[chest]
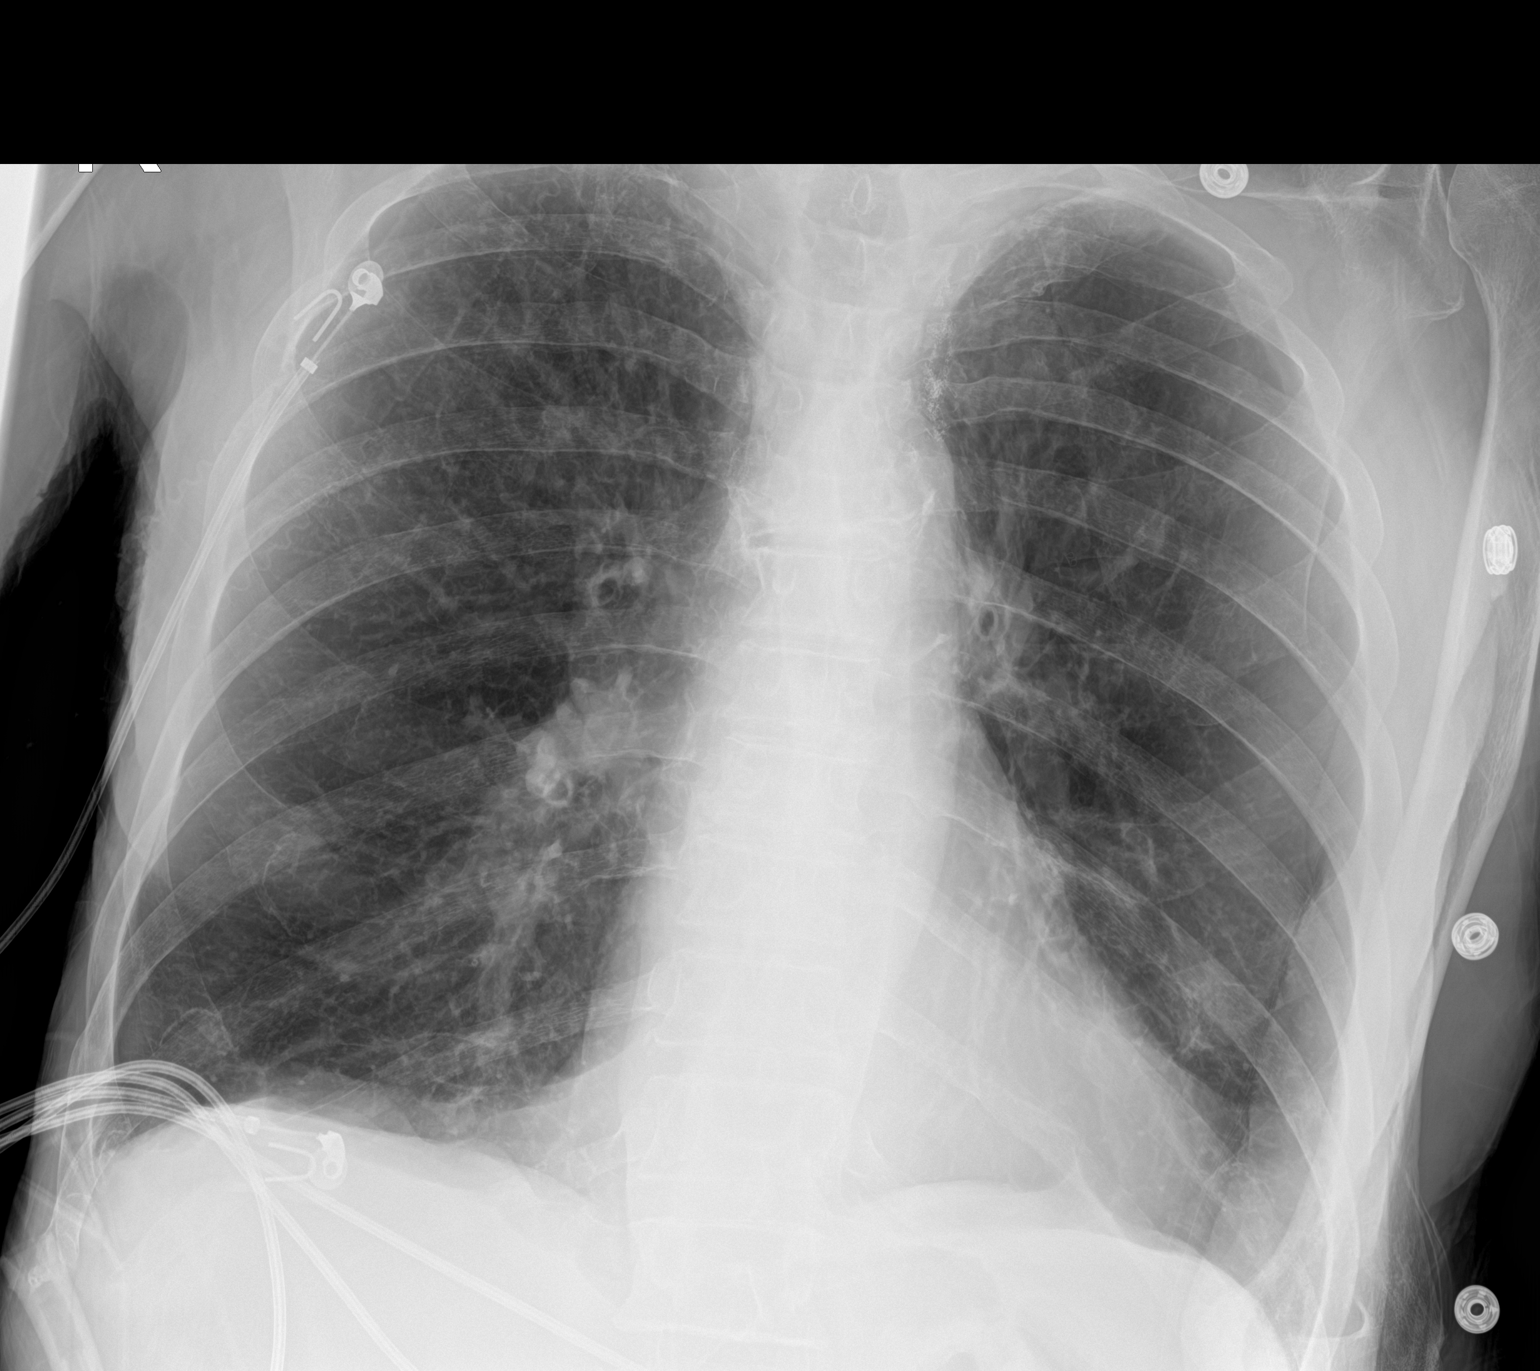

[1 of 1 positions shown; findings below may reference images not displayed]

FINDINGS: Normal mediastinum and cardiac silhouette. Lungs are hyperinflated.
No effusion, infiltrate or pneumothorax. Mild bronchitic markings.
IMPRESSION: Hyperinflated lungs.  No acute findings.

## 2021-08-03 DEATH — deceased
